# Patient Record
Sex: Female | Born: 1975 | Race: White | Hispanic: No | Marital: Married | Smoking: Former smoker
Health system: Southern US, Community
[De-identification: ages and names within clinical notes are randomized; demographics above are authoritative.]

## PROBLEM LIST (undated history)

## (undated) DIAGNOSIS — K589 Irritable bowel syndrome without diarrhea: Secondary | ICD-10-CM

## (undated) DIAGNOSIS — N979 Female infertility, unspecified: Secondary | ICD-10-CM

## (undated) DIAGNOSIS — F419 Anxiety disorder, unspecified: Secondary | ICD-10-CM

## (undated) DIAGNOSIS — K2270 Barrett's esophagus without dysplasia: Secondary | ICD-10-CM

## (undated) DIAGNOSIS — S129XXA Fracture of neck, unspecified, initial encounter: Secondary | ICD-10-CM

## (undated) DIAGNOSIS — A048 Other specified bacterial intestinal infections: Secondary | ICD-10-CM

## (undated) DIAGNOSIS — I10 Essential (primary) hypertension: Secondary | ICD-10-CM

## (undated) DIAGNOSIS — K219 Gastro-esophageal reflux disease without esophagitis: Secondary | ICD-10-CM

## (undated) HISTORY — DX: Essential (primary) hypertension: I10

## (undated) HISTORY — PX: HALO APPLICATION: SHX1720

## (undated) HISTORY — PX: DILATION AND CURETTAGE OF UTERUS: SHX78

## (undated) HISTORY — DX: Barrett's esophagus without dysplasia: K22.70

## (undated) HISTORY — DX: Anxiety disorder, unspecified: F41.9

## (undated) HISTORY — DX: Irritable bowel syndrome, unspecified: K58.9

## (undated) HISTORY — DX: Gastro-esophageal reflux disease without esophagitis: K21.9

## (undated) HISTORY — DX: Female infertility, unspecified: N97.9

---

## 2005-12-27 ENCOUNTER — Emergency Department (HOSPITAL_COMMUNITY): Admission: EM | Admit: 2005-12-27 | Discharge: 2005-12-28 | Payer: Self-pay | Admitting: Emergency Medicine

## 2006-01-03 ENCOUNTER — Ambulatory Visit (HOSPITAL_COMMUNITY): Admission: RE | Admit: 2006-01-03 | Discharge: 2006-01-03 | Payer: Self-pay | Admitting: Family Medicine

## 2007-04-11 ENCOUNTER — Ambulatory Visit (HOSPITAL_COMMUNITY): Admission: RE | Admit: 2007-04-11 | Discharge: 2007-04-11 | Payer: Self-pay | Admitting: Internal Medicine

## 2008-01-25 ENCOUNTER — Ambulatory Visit: Payer: Self-pay | Admitting: Internal Medicine

## 2008-01-25 ENCOUNTER — Ambulatory Visit (HOSPITAL_COMMUNITY): Admission: RE | Admit: 2008-01-25 | Discharge: 2008-01-25 | Payer: Self-pay | Admitting: Family Medicine

## 2008-02-11 ENCOUNTER — Ambulatory Visit: Payer: Self-pay | Admitting: Internal Medicine

## 2008-02-11 ENCOUNTER — Encounter: Payer: Self-pay | Admitting: Internal Medicine

## 2008-02-11 ENCOUNTER — Ambulatory Visit (HOSPITAL_COMMUNITY): Admission: RE | Admit: 2008-02-11 | Discharge: 2008-02-11 | Payer: Self-pay | Admitting: Internal Medicine

## 2008-02-28 ENCOUNTER — Emergency Department (HOSPITAL_COMMUNITY): Admission: EM | Admit: 2008-02-28 | Discharge: 2008-02-28 | Payer: Self-pay | Admitting: Emergency Medicine

## 2008-03-09 ENCOUNTER — Ambulatory Visit: Payer: Self-pay | Admitting: Gastroenterology

## 2008-06-15 ENCOUNTER — Ambulatory Visit: Payer: Self-pay | Admitting: Internal Medicine

## 2011-04-16 NOTE — Assessment & Plan Note (Signed)
NAMEMarland Kitchen  LOUNELL, SCHUMACHER                   CHART#:  [id number]   DATE:  03/09/2008                       DOB:  [birth year]   CHIEF COMPLAINT:  Follow-up regarding nausea, vomiting, diarrhea and  status post EGD.   HISTORY OF PRESENT ILLNESS:  Ms. Deborah Humphrey is a 35 year old female.  She has  history of chronic nausea, vomiting and epigastric pain.  She had an  upper GI SERIES which was suspicious for duodenitis.  She underwent EGD  which did show some duodenal erosions.  Biopsies were benign.  She was  found to be H.  Pylori positive, is status post Prevpac. She tells me  she has not been able to work.  She is having problems with her nausea  as well as postprandial diarrhea.  She has been having a lot of anxiety  and headaches as well.  She tells me about a week and a half after she  stopped her Prevpac she began to notice the nausea returning. She  started Nexium 40 mg daily.  She has noticed a little bit of difference  with this. She does seem to have loose stools about once a day up to 3  times a day about 2-3 days a week.  She had her last menstrual period 2  weeks ago.  She denies any history of constipation.  She denies any  history of rectal bleeding or melena.   Workup thus far has included an upper GI series which was on 01/25/08  which showed duodenitis.  She had an abdominal CT on 04/11/07 which  showed left adnexal lesion likely a cyst.  It was recommend she have a  follow-up ultrasound and she tells me she did follow up with her primary  care Deborah Humphrey regarding this. EGD with duodenal erosions accentuated  underlying benign diffuse mucosal gastric petechiae.   CURRENT MEDICATIONS:  See list from 03/09/08.   ALLERGIES:  No known drug allergies.   PHYSICAL EXAMINATION:  VITAL SIGNS:  Weight 143 pounds, height 63  inches, temperature 98.2.  Blood pressure 112/70, pulse 72.  GENERAL:  Ms. Deborah Humphrey is a well-developed, well-nourished Caucasian female  in no acute distress.  HEENT:   Sclera clear.  Nonicteric.  Oropharynx pink and moist without  lesions.  CHEST:  Heart regular rate and rhythm, normal S1/S2.  ABDOMEN:  Positive bowel sounds x4.  She does have an umbilical  ornament.  Abdomen is soft, nontender, nondistended, no palpable mass or  hepatosplenomegaly.  No rebound tenderness or guarding.  SKIN:  Pink, warm, dry, without rash, jaundice.   ASSESSMENT:  Ms. Deborah Humphrey is a 35 year old female with chronic GERD.  She  has chronic nausea. EGD showed duodenal erosions, gastric petechiae and  she has completed the Prevpac for being H. pylori positive.  She will  need to remain on PPI.   As far as her post prandial loose stools are concerned I feel she may  have irritable bowel syndrome.   PLAN:  1. Nexium 40 mg daily #60 with 3 refills.  2. I would like her to begin align probiotics once daily.  3. Levsin 0.125 milligrams a.c. and h.s. p.r.n. diarrhea, #60 with one      refill.  4. I would like her to follow up with Dr. Nobie Putnam regarding her  significant anxiety.  5. Office visit in 6 weeks with Dr. Jena Gauss to reassess how she is doing      and whether she is going to need further testing.  6. IBS literature given for her review.       Deborah Humphrey, N.P.  Electronically Signed     Kassie Mends, M.D.  Electronically Signed    KJ/MEDQ  D:  03/09/2008  T:  03/09/2008  Job:  [id number]

## 2011-04-16 NOTE — Consult Note (Signed)
Deborah, Humphrey                  ACCOUNT NO.:  [id number]   MEDICAL RECORD NO.:  [id number]          PATIENT TYPE:  AMB   LOCATION:  DAY                           FACILITY:  [facility]   PHYSICIAN:  R. Roetta Sessions, M.D. DATE OF BIRTH:  28-Jan-1976   DATE OF CONSULTATION:  DATE OF DISCHARGE:                                 CONSULTATION   REASON FOR CONSULTATION:  Chronic nausea, epigastric pain, and  tenderness.   HISTORY OF PRESENT ILLNESS:  Deborah Humphrey is a 35 year old female who  approximately one month ago she was having significant problems with  anxiety, headaches, and chronic upset stomach which included nausea,  epigastric tenderness, and vomiting.  She tells me she would stay  nauseous all day long.  She would awaken first thing in the morning with  nausea.  She did vomit on approximately four occasions.  Even when she  would not vomit she would have dry heaves.  She did have some epigastric  tenderness, but denies any severe pain, mostly discomfort.  She does  have daily heartburn and indigestion.  She was seen by Dr. Geanie Logan  office and found to be H. pylori positive.  She was started on a  Prevpac.  She took it for about a week, and then felt she was having  allergic reaction to it.  She stopped it and has since resumed it.  She  tells me she has four days left.  She has lost a total of 10 pounds in  the last month or so.  She admits to taking ibuprofen, but only a couple  of times per month.  She had an ultrasound at [facility] which  was normal January 25, 2008.  She had a CT scan of abdomen and pelvis  Apr 11, 2007.  She was found to have left ovarian cyst; otherwise,  normal exam.  She had an upper GI series on January 25, 2008 which  showed diffuse thickening of the folds of duodenal bulb compatible with  duodenitis.   PAST MEDICAL AND SURGICAL HISTORY:  Anxiety.  She has history of  cervical fracture as a child.   CURRENT MEDICATIONS:  1. Prevpac.  2.  Wellbutrin 50 mg b.i.d.  3. Ibuprofen p.r.n.   ALLERGIES:  No known drug allergies.   FAMILY HISTORY:  Mother has history of ulcerative colitis.  Father with  history of tremors.  She has two healthy sisters.   SOCIAL HISTORY:  Deborah Humphrey has been married for two months.  She has four  stepchildren.  She works a swing shift at [employer].  She has a  16 pack-year history of tobacco use.  Denies any alcohol or drug use.   REVIEW OF SYSTEMS:  See HPI.  GYN:  Last menstrual period February 2009.  She has very regular cycles.  She is not on birth control pills.  However, her husband has had a vasectomy.   PHYSICAL EXAMINATION:  VITAL SIGNS:  Weight 145 pounds, height 63  inches, temp 98.7, blood pressure 100/78, pulse 72.  GENERAL:  She is  a well-developed, well-nourished Caucasian female in no  acute distress.  HEENT.  Sclerae clear, nonicteric, conjunctivae pink.  Oropharynx pink  and moist without any lesions.  NECK:  Supple without any mass or thyromegaly.  CHEST:  Heart regular rate and rhythm, normal S1, S2 without any  murmurs, clicks, rubs or gallops.  LUNGS:  Clear to auscultation bilaterally.  ABDOMEN:  Positive bowel sounds x4.  No bruits auscultated.  Soft,  nondistended.  She does have mild tenderness to epigastrium on deep  palpation.  There is no rebound tenderness or guarding.  No  hepatosplenomegaly or mass.  She does have umbilical ornament.  EXTREMITIES:  Without clubbing or edema bilaterally.  SKIN:  Pink, warm, and dry.  She has multiple tattoos.   IMPRESSION:  Deborah Humphrey is a 35 year old female with a one month history  of chronic nausea, vomiting, and epigastric pain.  Upper GI series  suspicious for duodenitis and she was Helicobacter pylori positive.  I  suspect her symptoms are related to Helicobacter  Pylori/gastritis/duodenitis.  However, if her symptoms do not respond to  Prevpac would suggest further evaluation with EGD to rule out peptic  ulcer  disease.   PLAN:  1. Complete H. pylori treatment.  2. Then begin omeprazole 20 mg daily #30 with one refill.  3. We will go ahead and set her up for EGD in approximately two weeks.      If she does not have complete resolution of her symptoms she is      going to give me a call in the interim if things worsen or let me      know if they get better will postpone EGD.   Thank you, Dr. Nobie Putnam, for allowing [place] to participate in the care of  Ms. Hall.      Lorenza Burton, N.P.      Jonathon Bellows, M.D.  Electronically Signed    KJ/MEDQ  D:  01/26/2008  T:  01/26/2008  Job:  [id number]   cc:   Patrica Duel, M.D.  Fax: [phone]

## 2011-04-16 NOTE — Op Note (Signed)
Deborah Humphrey, Deborah Humphrey                  ACCOUNT NO.:  [id number]   MEDICAL RECORD NO.:  [id number]          PATIENT TYPE:  AMB   LOCATION:  DAY                           FACILITY:  [facility]   PHYSICIAN:  R. Roetta Sessions, M.D. DATE OF BIRTH:  [birth year]   DATE OF PROCEDURE:  02/11/3008  DATE OF DISCHARGE:  02/11/2008                               OPERATIVE REPORT   PROCEDURE:  Esophagogastroduodenoscopy with small bowel biopsy.   INDICATIONS FOR PROCEDURE:  A 35 year old lady with intermittent nausea  and epigastric pain, occasional vomiting.  Through Dr. Geanie Logan  office, H. pylori serologies were positive.  She admits to taking a pred  pack now.  Upper GI series demonstrated duodenitis but no obvious ulcer.  Prior CT demonstrated left ovarian cyst, otherwise unremarkable exam.   EGD is now being performed.  This approach has been discussed with the  patient at length.  The potential risks, benefits, and alternatives have  been reviewed and questions answered.  She is agreeable.  Please see  documentation on the medical record.   PROCEDURE NOTE:  O2 saturation, blood pressure, pulses, and respirations  were monitored throughout the entirety of the procedure.  Conscious  sedation with Versed 5 mg IV and Demerol 100 mg IV in divided doses.  Cetacaine spray for topical oropharyngeal anesthesia.   INSTRUMENT:  Pentax video chip system.   FINDINGS:  Examination of the tubular esophagus revealed no mucosal  abnormalities.  Patient had accentuating undulating Z line, otherwise  esophageal mucosa appeared normal.  The EG junction was easily  traversed.   STOMACH:  Gastric cavity was empty and insufflated well with air.  A  thorough examination of the gastric mucosa, included a retroflexed view  of the proximal stomach and the esophagogastric junction demonstrated  fine, diffuse submucosal petechiae.  There was no infiltrating process,  erosion, or other abnormality.  The pylorus was patent  and easily  traversed.  Examination of the bulb, second and third portion revealed  eroded mucosa extending down into the third portion of the duodenum  without frank ulcer or neoplasm being seen.  Biopsies of D2 and D3 were  taken for histologic study.  The patient tolerated the procedure well  and was reactive.   ENDOSCOPY IMPRESSION:  Accentuated, undulating Z line, otherwise normal  esophagus.  Diffuse mucosal gastric petechiae, otherwise normal stomach.  Patent pylorus.  Duodenal erosions from the bulb through this third  portion of the duodenum, status post biopsy.   RECOMMENDATIONS:  Complete Pred-Pak and continue PPI in the way of  Prevacid 30 mg orally daily.  A follow-up appointment with [place] in six  weeks to assess her progress.      Jonathon Bellows, M.D.  Electronically Signed     RMR/MEDQ  D:  02/11/2008  T:  02/11/2008  Job:  [id number]   cc:   Patrica Duel, M.D.  Fax: [phone]

## 2011-04-16 NOTE — Assessment & Plan Note (Signed)
Deborah Humphrey, Deborah Humphrey                   CHART#:  [id number]   DATE:  06/15/2008                       DOB:  12-24-1975   CHIEF COMPLAINT:  Diarrhea, nausea, and fatigue.   PROBLEM LIST:  1. Chronic gastroesophageal reflux disease and nausea.  2. Duodenitis.  3. Helicobacter pylori positive, status post Prevpac treatment.  4. Irritable bowel syndrome.  5. Anxiety.  6. Cervical fracture as a child.   SUBJECTIVE:  The patient is a 35 year old Caucasian female who has  history of duodenitis, chronic GERD and nausea and H. Pylori.  She is  status post EGD and benign small bowel biopsy.  She has also had  abdominal ultrasound and CT of the abdomen and pelvis with oral and IV  contrast and upper GI series.  None of which have explain her chronic  nausea nor diarrhea.  She has been off Nexium for about 3 months now.  She notes she is under a significant amount of anxiety with her job.  She has been eating less.  She has lost about 5 pounds since the last  office visit 3 months ago.  She has lost a total of 15-20 pounds within  the last 7 months.  She is having intermittent diarrhea anywhere up to 4  times per day about 3 days a week.  She does not have regular bowel  movements in between, she denies any rectal bleeding or melena.  She did  have a TSH checked in February 2009, which was normal.  She does have  history of left ovarian cyst, which she believes resolved.  She is  followed by Dr. Emelda Fear for this.   CURRENT MEDICATIONS:  See the list from 06/15/2008.   ALLERGIES:  No known drug allergies.   OBJECTIVE:  VITAL SIGNS:  Weight 140 pounds, height 63 inches,  temperature 97.9, blood pressure 112/70, and pulse 60.  GENERAL:  She is a well-developed, well-nourished Caucasian female in no  acute distress.  HEENT:  Sclerae clear.  Nonicteric.  Conjunctivae pink.  Oropharynx pink  and moist without any lesions.  CHEST:  Heart regular rate and rhythm.  Normal S1 and S2 without  murmurs, clicks, rubs, or gallops.  ABDOMEN:  Positive bowel sounds x4.  No bruits auscultated.  Soft,  nontender, and nondistended without palpable mass or hepatosplenomegaly.  No rebound, tenderness, or guarding.  EXTREMITIES:  Without clubbing or edema.   ASSESSMENT:  The patient is a 35 year old Caucasian female with what I  believe is irritable bowel syndrome.  However, her weight loss is  concerning.  I am concerned that she has functional abdominal pain and  gastroesophageal reflux disease contributing to her symptoms, and  therefore is limiting her p.o. intake.  She does have significant amount  of free-floating anxiety as well.  She has  history of Helicobacter  pylori status post triple-drug therapy.   PLAN:  1. Full set of stool studies to include ova and parasites, culture and      sensitivity,  lactoferrin, and Clostridium difficile.  2. We will check stool for Helicobacter pylori antigen.  3. Resume Nexium 40 mg daily, #31 with 5 refills.  4. Levsin 0.125 mg a.c. and h.s. up to q.i.d., #60 with 2 refills.  5. CBC, MET-7, and LFTs.  6. We will schedule for followup,  pending labs.  7. She is to follow up with Dr. Nobie Putnam regarding her anxiety.       Lorenza Burton, N.P.  Electronically Signed     R. Roetta Sessions, M.D.  Electronically Signed    KJ/MEDQ  D:  06/15/2008  T:  06/16/2008  Job:  [id number]   cc:   Patrica Duel, M.D.

## 2011-08-26 LAB — DIFFERENTIAL
Basophils Absolute: 0
Basophils Relative: 0
Eosinophils Absolute: 0.1
Eosinophils Relative: 1
Lymphocytes Relative: 24
Lymphs Abs: 1.5
Monocytes Absolute: 0.4
Monocytes Relative: 7
Neutro Abs: 4.2
Neutrophils Relative %: 68

## 2011-08-26 LAB — URINALYSIS, ROUTINE W REFLEX MICROSCOPIC
Bilirubin Urine: NEGATIVE
Glucose, UA: NEGATIVE
Hgb urine dipstick: NEGATIVE
Nitrite: NEGATIVE
Protein, ur: NEGATIVE
Specific Gravity, Urine: >1.03 — ABNORMAL HIGH
Urobilinogen, UA: 0.2
pH: 6

## 2011-08-26 LAB — LIPASE, BLOOD: Lipase: 27

## 2011-08-26 LAB — COMPREHENSIVE METABOLIC PANEL
ALT: 12
AST: 17
Albumin: 3.8
Alkaline Phosphatase: 55
BUN: 9
CO2: 25
Calcium: 9.3
Chloride: 108
Creatinine, Ser: 0.82
GFR calc Af Amer: >60
GFR calc non Af Amer: >60
Glucose, Bld: 91
Potassium: 3.8
Sodium: 138
Total Bilirubin: 0.6
Total Protein: 6.1

## 2011-08-26 LAB — CBC
HCT: 41.3
Hemoglobin: 14.5
MCHC: 35.1
MCV: 90.1
Platelets: 173
RBC: 4.58
RDW: 12.4
WBC: 6.3

## 2011-08-26 LAB — PREGNANCY, URINE: Preg Test, Ur: NEGATIVE

## 2011-12-01 ENCOUNTER — Encounter: Payer: Self-pay | Admitting: Emergency Medicine

## 2011-12-01 ENCOUNTER — Emergency Department (HOSPITAL_COMMUNITY)
Admission: EM | Admit: 2011-12-01 | Discharge: 2011-12-01 | Disposition: A | Discharge status: Home / Self Care | Payer: Self-pay | Attending: Emergency Medicine | Admitting: Emergency Medicine

## 2011-12-01 DIAGNOSIS — R112 Nausea with vomiting, unspecified: Secondary | ICD-10-CM | POA: Insufficient documentation

## 2011-12-01 DIAGNOSIS — K2970 Gastritis, unspecified, without bleeding: Secondary | ICD-10-CM | POA: Insufficient documentation

## 2011-12-01 DIAGNOSIS — R197 Diarrhea, unspecified: Secondary | ICD-10-CM | POA: Insufficient documentation

## 2011-12-01 DIAGNOSIS — R109 Unspecified abdominal pain: Secondary | ICD-10-CM | POA: Insufficient documentation

## 2011-12-01 DIAGNOSIS — K2990 Gastroduodenitis, unspecified, without bleeding: Secondary | ICD-10-CM | POA: Insufficient documentation

## 2011-12-01 HISTORY — DX: Other specified bacterial intestinal infections: A04.8

## 2011-12-01 MED ORDER — FAMOTIDINE 20 MG PO TABS
20.0000 mg | ORAL_TABLET | Freq: Once | ORAL | Status: AC
  Administered 2011-12-01: 20 mg via ORAL
  Filled 2011-12-01: qty 1

## 2011-12-01 MED ORDER — PANTOPRAZOLE SODIUM 40 MG PO TBEC
40.0000 mg | DELAYED_RELEASE_TABLET | Freq: Once | ORAL | Status: AC
Start: 2011-12-01 — End: 2011-12-01
  Administered 2011-12-01: 40 mg via ORAL
  Filled 2011-12-01: qty 1

## 2011-12-01 NOTE — ED Notes (Signed)
Patient c/o epigastric pain with nausea, vomiting, indigestion, and diarrhea x1 week. Per patient diagnosed with h pylori in 2009 and had the same symptoms. Patient reports taking tums, ibuprofen, and tylenol with no relief.

## 2011-12-01 NOTE — ED Provider Notes (Signed)
History   This chart was scribed for [employer]. Colon Branch, MD scribed by Magnus Sinning. The patient was seen in room APA06/[facility]06    CSN: [id number]  Arrival date & time 12/01/11  1228   First MD Initiated Contact with Patient 12/01/11 1555      Chief Complaint  Patient presents with  . Gastrophageal Reflux  . Nausea  . Emesis  . Diarrhea  . Abdominal Pain    (Consider location/radiation/quality/duration/timing/severity/associated sxs/prior treatment) HPI Deborah Humphrey is a 35 y.o. female who presents to the Emergency Department complaining of a constant moderate epigastric pain with associated n/v/d, onset 4 days ago. Pt reports that she took Pepto Bismuth and Tylenol with no relief. Denies any changes in diet, or fevers. Pt additionally reports that her last normal BM was yesterday and that she did take an anti-diarrheal medication with relief. Pt states that she was dx'd with pylori in 2009 and has previously had the same symptoms she is experiencing today. Pt says that she is a not a current smoker.   Past Medical History  Diagnosis Date  . Helicobacter pylori (H. pylori)     History reviewed. No pertinent past surgical history.  Family History  Problem Relation Age of Onset  . Diabetes Other   . Cancer Other     History  Substance Use Topics  . Smoking status: Former Smoker -- 1.0 packs/day for 15 years    Types: Cigarettes    Quit date: 01/31/2011  . Smokeless tobacco: Never Used  . Alcohol Use: No   Review of Systems 10 Systems reviewed and are negative for acute change except as noted in the HPI. Allergies  Review of patient's allergies indicates no known allergies.  Home Medications  No current outpatient prescriptions on file.  BP 145/75  Pulse 79  Temp(Src) 98.8 F (37.1 C) (Oral)  Resp 20  Ht 5\' 3"  (1.6 m)  Wt 195 lb (88.451 kg)  BMI 34.54 kg/m2  SpO2 100%  LMP 11/11/2011  Physical Exam  Nursing note and vitals reviewed. Constitutional: She is  oriented to person, place, and time. She appears well-developed and well-nourished. No distress.  HENT:  Head: Normocephalic and atraumatic.  Mouth/Throat: Oropharynx is clear and moist.  Eyes: EOM are normal. Pupils are equal, round, and reactive to light.  Neck: Neck supple. No tracheal deviation present.  Cardiovascular: Normal rate, regular rhythm and normal heart sounds.  Exam reveals no gallop and no friction rub.   No murmur heard. Pulmonary/Chest: Effort normal and breath sounds normal. No respiratory distress.  Abdominal: Soft. Bowel sounds are normal. She exhibits no distension. There is no tenderness. There is no rebound and no guarding.  Musculoskeletal: Normal range of motion. She exhibits no edema.  Neurological: She is alert and oriented to person, place, and time. No sensory deficit.  Skin: Skin is warm and dry.  Psychiatric: She has a normal mood and affect. Her behavior is normal.    ED Course  Procedures (including critical care time) DIAGNOSTIC STUDIES: Oxygen Saturation is 100% on room air, normal by my interpretation.    COORDINATION OF CARE:    MDM  Patient with reflux/gastritis symptoms for 1 week. Initiated pepcid AC. Provided referral to GI.Pt stable in ED with no significant deterioration in condition.The patient appears reasonably screened and/or stabilized for discharge and I doubt any other medical condition or other [facility] requiring further screening, evaluation, or treatment in the ED at this time prior to discharge.  I personally  performed the services described in this documentation, which was scribed in my presence. The recorded information has been reviewed and considered.   MDM Reviewed: nursing note and vitals         Nicoletta Dress. Colon Branch, MD 12/01/11 [phone]

## 2011-12-01 NOTE — ED Notes (Signed)
Pt states was diagnosed with H-pyloric a few yrs ago and feels she is having the same symptoms. Pt reports N/V after eating, loose stools, gastric reflux and generalized abd discomfort. Pt denies fever.

## 2012-05-21 ENCOUNTER — Encounter (HOSPITAL_COMMUNITY): Payer: Self-pay | Admitting: Emergency Medicine

## 2012-05-21 ENCOUNTER — Emergency Department (HOSPITAL_COMMUNITY)
Admission: EM | Admit: 2012-05-21 | Discharge: 2012-05-21 | Disposition: A | Discharge status: Home / Self Care | Payer: Self-pay | Attending: Emergency Medicine | Admitting: Emergency Medicine

## 2012-05-21 DIAGNOSIS — Z833 Family history of diabetes mellitus: Secondary | ICD-10-CM | POA: Insufficient documentation

## 2012-05-21 DIAGNOSIS — Z87891 Personal history of nicotine dependence: Secondary | ICD-10-CM | POA: Insufficient documentation

## 2012-05-21 DIAGNOSIS — W57XXXA Bitten or stung by nonvenomous insect and other nonvenomous arthropods, initial encounter: Secondary | ICD-10-CM | POA: Insufficient documentation

## 2012-05-21 DIAGNOSIS — S30860A Insect bite (nonvenomous) of lower back and pelvis, initial encounter: Secondary | ICD-10-CM | POA: Insufficient documentation

## 2012-05-21 HISTORY — DX: Fracture of neck, unspecified, initial encounter: S12.9XXA

## 2012-05-21 MED ORDER — PREDNISONE 20 MG PO TABS
60.0000 mg | ORAL_TABLET | Freq: Once | ORAL | Status: AC
  Administered 2012-05-21: 60 mg via ORAL
  Filled 2012-05-21: qty 3

## 2012-05-21 MED ORDER — DIPHENHYDRAMINE HCL 25 MG PO CAPS
25.0000 mg | ORAL_CAPSULE | Freq: Once | ORAL | Status: AC
  Administered 2012-05-21: 25 mg via ORAL
  Filled 2012-05-21: qty 1

## 2012-05-21 NOTE — ED Provider Notes (Signed)
History     CSN: [id number]  Arrival date & time 05/21/12  2027   First MD Initiated Contact with Patient 05/21/12 2049      Chief Complaint  Patient presents with  . Insect Bite    (Consider location/radiation/quality/duration/timing/severity/associated sxs/prior treatment) HPI Comments: Patient states she sustained a bite or sustained approximately 2 days ago. She has noticed a reddened area of the right lower abdomen. This area initially had a small blister in the center, but this has now ruptured with minimal drainage. The area is quite sore and painful with a burning sensation at times. Tonight the patient states that the pain seemed to have gotten worse and caused her to be nauseated and not feeling well. She presents to the emergency department for evaluation of this problem.  The history is provided by the patient.    Past Medical History  Diagnosis Date  . Helicobacter pylori (H. pylori)   . Cervical spine fracture     Denies havign surgery     History reviewed. No pertinent past surgical history.  Family History  Problem Relation Age of Onset  . Diabetes Other   . Cancer Other     History  Substance Use Topics  . Smoking status: Former Smoker -- 1.0 packs/day for 15 years    Types: Cigarettes    Quit date: 01/31/2011  . Smokeless tobacco: Never Used  . Alcohol Use: No    OB History    Grav Para Term Preterm Abortions TAB SAB Ect Mult Living            0      Review of Systems  Constitutional: Negative for activity change.       All ROS Neg except as noted in HPI  HENT: Negative for nosebleeds and neck pain.   Eyes: Negative for photophobia and discharge.  Respiratory: Negative for cough, shortness of breath and wheezing.   Cardiovascular: Negative for chest pain and palpitations.  Gastrointestinal: Positive for nausea. Negative for abdominal pain and blood in stool.  Genitourinary: Negative for dysuria, frequency and hematuria.  Musculoskeletal:  Negative for back pain and arthralgias.  Skin: Negative.        Insect bites  Neurological: Positive for light-headedness. Negative for dizziness, seizures and speech difficulty.  Psychiatric/Behavioral: Negative for hallucinations and confusion.    Allergies  Bee venom  Home Medications  No current outpatient prescriptions on file.  BP 133/83  Pulse 64  Temp 98.6 F (37 C) (Oral)  Resp 20  Ht 5\' 3"  (1.6 m)  Wt 190 lb (86.183 kg)  BMI 33.66 kg/m2  SpO2 100%  LMP 04/30/2012  Physical Exam  Nursing note and vitals reviewed. Constitutional: She is oriented to person, place, and time. She appears well-developed and well-nourished.  Non-toxic appearance.  HENT:  Head: Normocephalic.  Right Ear: Tympanic membrane and external ear normal.  Left Ear: Tympanic membrane and external ear normal.       Airway is patent, uvula is in the midline. Speech is clear and understandable.  Eyes: EOM and lids are normal. Pupils are equal, round, and reactive to light.  Neck: Normal range of motion. Neck supple. Carotid bruit is not present.  Cardiovascular: Normal rate, regular rhythm, normal heart sounds, intact distal pulses and normal pulses.   Pulmonary/Chest: Breath sounds normal. No respiratory distress.       No wheezes, no difficulty with breathing, no use of assessor and muscles.  Abdominal: Soft. Bowel sounds are normal. There is  no tenderness. There is no guarding.       There is a 4 cm reddened area of the right lower abdomen with what looks like a ruptured blister in the center. There no satellite abscess areas around this. There no red streaking. The area is not hot.  Musculoskeletal: Normal range of motion.  Lymphadenopathy:       Head (right side): No submandibular adenopathy present.       Head (left side): No submandibular adenopathy present.    She has no cervical adenopathy.  Neurological: She is alert and oriented to person, place, and time. She has normal strength. No  cranial nerve deficit or sensory deficit.  Skin: Skin is warm and dry.  Psychiatric: She has a normal mood and affect. Her speech is normal.    ED Course  Procedures (including critical care time)  Labs Reviewed - No data to display No results found.   No diagnosis found.    MDM  I have reviewed nursing notes, vital signs, and all appropriate lab and imaging results for this patient. Patient sustained an insect bite approximately 2 days ago she now has a red area with minimal drainage at the right lower portion of the abdomen. Patient was reassured that her lungs were clear her airway was fine. The patient is asked to use Allegra in the morning for itching or burning sensation, and Benadryl at night if needed. She is asked to soak in a couple warm water daily until this resolves. She's advised to return to the emergency department if there is abscess formation, or signs of infection.       Kathie Dike, [place] 05/21/12 2107

## 2012-05-21 NOTE — Discharge Instructions (Signed)
Please soak the lower abdomen bite area daily for 15 min. Use allegra each morning for itching or burning. Use benadryl at bedtime if needed. Observe for abscess or infection. Return if any changes or problems.Insect Bite Mosquitoes, flies, fleas, bedbugs, and many other insects can bite. Insect bites are different from insect stings. A sting is when venom is injected into the skin. Some insect bites can transmit infectious diseases. SYMPTOMS  Insect bites usually turn red, swell, and itch for 2 to 4 days. They often go away on their own. TREATMENT  Your caregiver may prescribe antibiotic medicines if a bacterial infection develops in the bite. HOME CARE INSTRUCTIONS  Do not scratch the bite area.   Keep the bite area clean and dry. Wash the bite area thoroughly with soap and water.   Put ice or cool compresses on the bite area.   Put ice in a plastic bag.   Place a towel between your skin and the bag.   Leave the ice on for 20 minutes, 4 times a day for the first 2 to 3 days, or as directed.   You may apply a baking soda paste, cortisone cream, or calamine lotion to the bite area as directed by your caregiver. This can help reduce itching and swelling.   Only take over-the-counter or prescription medicines as directed by your caregiver.   If you are given antibiotics, take them as directed. Finish them even if you start to feel better.  You may need a tetanus shot if:  You cannot remember when you had your last tetanus shot.   You have never had a tetanus shot.   The injury broke your skin.  If you get a tetanus shot, your arm may swell, get red, and feel warm to the touch. This is common and not a problem. If you need a tetanus shot and you choose not to have one, there is a rare chance of getting tetanus. Sickness from tetanus can be serious. SEEK IMMEDIATE MEDICAL CARE IF:   You have increased pain, redness, or swelling in the bite area.   You see a red line on the skin  coming from the bite.   You have a fever.   You have joint pain.   You have a headache or neck pain.   You have unusual weakness.   You have a rash.   You have chest pain or shortness of breath.   You have abdominal pain, nausea, or vomiting.   You feel unusually tired or sleepy.  MAKE SURE YOU:   Understand these instructions.   Will watch your condition.   Will get help right away if you are not doing well or get worse.  Document Released: 12/26/2004 Document Revised: 11/07/2011 Document Reviewed: 06/19/2011 [facility] Patient Information 2012 [place], [place].

## 2012-05-21 NOTE — ED Notes (Signed)
Pt states she thinks she got bit by an insect 2 days ago. Noticed a reddened area that is about 4-5cm in diameter. States she has also noticed some drainage from the area as well. States it is painful 7/10. Also complains of some nausea. Denies v/d

## 2012-05-21 NOTE — ED Provider Notes (Signed)
Medical screening examination/treatment/procedure(s) were performed by non-physician practitioner and as supervising physician I was immediately available for consultation/collaboration.   Dayton Bailiff, MD 05/21/12 2108

## 2012-05-21 NOTE — ED Notes (Signed)
Pt alert & oriented x4, stable gait. Pt given discharge instructions, paperwork. Patient instructed to stop at the registration desk to finish any additional paperwork. pt verbalized understanding. Pt left department w/ no further questions.  

## 2016-03-06 ENCOUNTER — Encounter (INDEPENDENT_AMBULATORY_CARE_PROVIDER_SITE_OTHER): Payer: Self-pay | Admitting: *Deleted

## 2016-04-01 ENCOUNTER — Ambulatory Visit (INDEPENDENT_AMBULATORY_CARE_PROVIDER_SITE_OTHER): Payer: Self-pay | Admitting: Internal Medicine

## 2019-01-27 ENCOUNTER — Other Ambulatory Visit: Payer: Self-pay | Admitting: Obstetrics and Gynecology

## 2019-02-03 ENCOUNTER — Other Ambulatory Visit: Payer: Self-pay | Admitting: Obstetrics and Gynecology

## 2019-02-03 DIAGNOSIS — Z803 Family history of malignant neoplasm of breast: Secondary | ICD-10-CM

## 2019-02-14 ENCOUNTER — Other Ambulatory Visit: Payer: Self-pay

## 2019-07-05 ENCOUNTER — Other Ambulatory Visit: Payer: Self-pay

## 2019-07-05 DIAGNOSIS — Z20822 Contact with and (suspected) exposure to covid-19: Secondary | ICD-10-CM

## 2019-07-06 LAB — NOVEL CORONAVIRUS, NAA: SARS-CoV-2, NAA: NOT DETECTED

## 2020-06-21 ENCOUNTER — Ambulatory Visit (INDEPENDENT_AMBULATORY_CARE_PROVIDER_SITE_OTHER): Payer: 59 | Admitting: Adult Health

## 2020-06-21 ENCOUNTER — Encounter: Payer: Self-pay | Admitting: Adult Health

## 2020-06-21 ENCOUNTER — Other Ambulatory Visit: Payer: Self-pay

## 2020-06-21 VITALS — BP 127/81 | HR 83 | Ht 63.0 in | Wt 223.0 lb

## 2020-06-21 DIAGNOSIS — N920 Excessive and frequent menstruation with regular cycle: Secondary | ICD-10-CM | POA: Diagnosis not present

## 2020-06-21 DIAGNOSIS — R6882 Decreased libido: Secondary | ICD-10-CM | POA: Insufficient documentation

## 2020-06-21 DIAGNOSIS — N946 Dysmenorrhea, unspecified: Secondary | ICD-10-CM | POA: Diagnosis not present

## 2020-06-21 DIAGNOSIS — N941 Unspecified dyspareunia: Secondary | ICD-10-CM | POA: Diagnosis not present

## 2020-06-21 NOTE — Progress Notes (Signed)
  Subjective:     Patient ID: Deborah Humphrey, female   DOB: 07-Oct-1976, 44 y.o.   MRN: [id number]  HPI Deborah Humphrey is a 44 year old white female, married, G1P1 in complaining of bad periods, periods last 7 days when not on depo and the first 3-4 days are heavy, changes pads and tampons every hour and had bad cramps and has decreased sex drive and pain with sex. Has had sharp pains from that radiate to rectum. She said she has been on birth control since teens and then had fertility issues. Her next dep is due 07/19/20.  She had normal pap in December 2020, in [place] She does not want any more children.  PCP is Dr Deborah Humphrey  Review of Systems  +heavy painful periods,see HPI  +pain with sex +decreased sex drive   Reviewed past medical,surgical, social and family history. Reviewed medications and allergies.     Objective:   Physical Exam BP 127/81 (BP Location: Left Arm, Patient Position: Sitting, Cuff Size: Normal)   Pulse 83   Ht 5\' 3"  (1.6 m)   Wt 223 lb (101.2 kg)   BMI 39.50 kg/m  Skin warm and dry. Neck: mid line trachea, normal thyroid, good ROM, no lymphadenopathy noted. Lungs: clear to ausculation bilaterally. Cardiovascular: regular rate and rhythm. AA is 3 Fall risk is low PHQ 9 score is 5, no SI  Upstream - 06/21/20 1522      Pregnancy Intention Screening   Does the patient want to become pregnant in the next year? No   Simultaneous filing. User may not have seen previous data.   Does the patient's partner want to become pregnant in the next year? No   Simultaneous filing. User may not have seen previous data.   Would the patient like to discuss contraceptive options today? Yes   Simultaneous filing. User may not have seen previous data.     Contraception Wrap Up   Current Method Hormonal Injection   Simultaneous filing. User may not have seen previous data.   End Method Hormonal Injection    Contraception Counseling Provided Yes   Simultaneous filing. User may not have seen  previous data.            Assessment:     1. Menorrhagia with regular cycle Will get GYN [place] in about a week then see me 2-3 days to discuss it and options  Discussed endometrial ablation vs POP  and orlissa,given handouts on Orlissa and endo ablation Could stay on depo and take Edward Qualia   Could have endometriosis    2. Dysmenorrhea  3. Dyspareunia in female   4. Decreased libido Could be related to depo     Plan:     Get [place] in about a week then see me 2-3 days later    -she will request records

## 2020-06-30 ENCOUNTER — Ambulatory Visit (INDEPENDENT_AMBULATORY_CARE_PROVIDER_SITE_OTHER): Payer: 59

## 2020-06-30 DIAGNOSIS — N920 Excessive and frequent menstruation with regular cycle: Secondary | ICD-10-CM | POA: Diagnosis not present

## 2020-06-30 DIAGNOSIS — N941 Unspecified dyspareunia: Secondary | ICD-10-CM | POA: Diagnosis not present

## 2020-06-30 DIAGNOSIS — N946 Dysmenorrhea, unspecified: Secondary | ICD-10-CM | POA: Diagnosis not present

## 2020-06-30 NOTE — Progress Notes (Signed)
US PELVIC TA/TV: heterogeneous anteverted uterus,anterior mid/lt intramural fibroid 3.1 X 2.3 X 2.3 cm,EEC 4.2 mm,normal ovaries,ovaries appear mobile,no free fluid,some discomfort during ultrasound  Chaperone Peggy

## 2020-07-05 ENCOUNTER — Ambulatory Visit (INDEPENDENT_AMBULATORY_CARE_PROVIDER_SITE_OTHER): Payer: 59 | Admitting: Adult Health

## 2020-07-05 ENCOUNTER — Encounter: Payer: Self-pay | Admitting: Adult Health

## 2020-07-05 VITALS — BP 117/78 | HR 70 | Ht 63.0 in | Wt 224.0 lb

## 2020-07-05 DIAGNOSIS — N941 Unspecified dyspareunia: Secondary | ICD-10-CM

## 2020-07-05 DIAGNOSIS — N946 Dysmenorrhea, unspecified: Secondary | ICD-10-CM

## 2020-07-05 DIAGNOSIS — R6882 Decreased libido: Secondary | ICD-10-CM

## 2020-07-05 DIAGNOSIS — N920 Excessive and frequent menstruation with regular cycle: Secondary | ICD-10-CM | POA: Diagnosis not present

## 2020-07-05 NOTE — Progress Notes (Addendum)
  Subjective:     Patient ID: Deborah Humphrey, female   DOB: [birth year], 44 y.o.   MRN: [id number]  HPI Deborah Humphrey is a 44 year old white female, married, G1P1 in with her husband to discuss her [place]. Her requested records have not arrived.  PCP is Dr Quintin Alto.  Review of Systems Has heavy painful periods, now on depo Has pain with sex Has decreased libido. Does not want more children Reviewed past medical,surgical, social and family history. Reviewed medications and allergies.     Objective:   Physical Exam BP 117/78 (BP Location: Left Arm, Patient Position: Sitting, Cuff Size: Normal)   Pulse 70   Ht 5\' 3"  (1.6 m)   Wt 224 lb (101.6 kg)   BMI 39.68 kg/m    Talk only:  US showed heterogenous uterus with small intramural fibroid, 3.1 cm x 2.3 cm x 2.3 cm, normal ovaries, no fluid and EEC 4.2 mm. She had some pain with [place]. Time with pt and husband was 27 minutes  Assessment:     1. Menorrhagia with regular cycle Discussed ablation and tubal vs POP and orlissa and she is leaning toward ablation and tubal with tubes removed to decrease ovarian cancer risk   2. Dysmenorrhea   3. Decreased libido   4. Dyspareunia in female     Plan:    Review handout on POP  Return 8/18 to talk with Dr Glo Herring about ablation and tubal as option and get his thoughts

## 2020-07-05 NOTE — Patient Instructions (Signed)
Norethindrone tablets (contraception) What is this medicine? NORETHINDRONE (nor eth IN drone) is an oral contraceptive. The product contains a female hormone known as a progestin. It is used to prevent pregnancy. This medicine may be used for other purposes; ask your health care provider or pharmacist if you have questions. COMMON BRAND NAME(S): Camila, Deblitane 28-Day, Errin, Heather, Jencycla, Jolivette, Lyza, Nor-QD, Nora-BE, Norlyroc, Ortho Micronor, Sharobel 28-Day What should I tell my health care provider before I take this medicine? They need to know if you have any of these conditions:  blood vessel disease or blood clots  breast, cervical, or vaginal cancer  diabetes  heart disease  kidney disease  liver disease  mental depression  migraine  seizures  stroke  vaginal bleeding  an unusual or allergic reaction to norethindrone, other medicines, foods, dyes, or preservatives  pregnant or trying to get pregnant  breast-feeding How should I use this medicine? Take this medicine by mouth with a glass of water. You may take it with or without food. Follow the directions on the prescription label. Take this medicine at the same time each day and in the order directed on the package. Do not take your medicine more often than directed. Contact your pediatrician regarding the use of this medicine in children. Special care may be needed. This medicine has been used in female children who have started having menstrual periods. A patient package insert for the product will be given with each prescription and refill. Read this sheet carefully each time. The sheet may change frequently. Overdosage: If you think you have taken too much of this medicine contact a poison control center or emergency room at once. NOTE: This medicine is only for you. Do not share this medicine with others. What if I miss a dose? Try not to miss a dose. Every time you miss a dose or take a dose late  your chance of pregnancy increases. When 1 pill is missed (even if only 3 hours late), take the missed pill as soon as possible and continue taking a pill each day at the regular time (use a back up method of birth control for the next 48 hours). If more than 1 dose is missed, use an additional birth control method for the rest of your pill pack until menses occurs. Contact your health care professional if more than 1 dose has been missed. What may interact with this medicine? Do not take this medicine with any of the following medications:  amprenavir or fosamprenavir  bosentan This medicine may also interact with the following medications:  antibiotics or medicines for infections, especially rifampin, rifabutin, rifapentine, and griseofulvin, and possibly penicillins or tetracyclines  aprepitant  barbiturate medicines, such as phenobarbital  carbamazepine  felbamate  modafinil  oxcarbazepine  phenytoin  ritonavir or other medicines for HIV infection or AIDS  St. John's wort  topiramate This list may not describe all possible interactions. Give your health care provider a list of all the medicines, herbs, non-prescription drugs, or dietary supplements you use. Also tell them if you smoke, drink alcohol, or use illegal drugs. Some items may interact with your medicine. What should I watch for while using this medicine? Visit your doctor or health care professional for regular checks on your progress. You will need a regular breast and pelvic exam and Pap smear while on this medicine. Use an additional method of birth control during the first cycle that you take these tablets. If you have any reason to think you   are pregnant, stop taking this medicine right away and contact your doctor or health care professional. If you are taking this medicine for hormone related problems, it may take several cycles of use to see improvement in your condition. This medicine does not protect you  against HIV infection (AIDS) or any other sexually transmitted diseases. What side effects may I notice from receiving this medicine? Side effects that you should report to your doctor or health care professional as soon as possible:  breast tenderness or discharge  pain in the abdomen, chest, groin or leg  severe headache  skin rash, itching, or hives  sudden shortness of breath  unusually weak or tired  vision or speech problems  yellowing of skin or eyes Side effects that usually do not require medical attention (report to your doctor or health care professional if they continue or are bothersome):  changes in sexual desire  change in menstrual flow  facial hair growth  fluid retention and swelling  headache  irritability  nausea  weight gain or loss This list may not describe all possible side effects. Call your doctor for medical advice about side effects. You may report side effects to FDA at 1-800-FDA-1088. Where should I keep my medicine? Keep out of the reach of children. Store at room temperature between 15 and 30 degrees C (59 and 86 degrees F). Throw away any unused medicine after the expiration date. NOTE: This sheet is a summary. It may not cover all possible information. If you have questions about this medicine, talk to your doctor, pharmacist, or health care provider.  2020 Elsevier/Gold Standard (2012-08-07 16:41:35)  

## 2020-07-19 ENCOUNTER — Ambulatory Visit: Payer: 59 | Admitting: Obstetrics and Gynecology

## 2020-07-19 ENCOUNTER — Other Ambulatory Visit: Payer: Self-pay

## 2020-07-19 ENCOUNTER — Encounter: Payer: Self-pay | Admitting: Obstetrics and Gynecology

## 2020-07-19 VITALS — BP 132/79 | HR 87 | Ht 63.0 in | Wt 223.6 lb

## 2020-07-19 DIAGNOSIS — Z302 Encounter for sterilization: Secondary | ICD-10-CM

## 2020-07-19 DIAGNOSIS — N920 Excessive and frequent menstruation with regular cycle: Secondary | ICD-10-CM | POA: Diagnosis not present

## 2020-07-19 NOTE — Patient Instructions (Addendum)
Uterine Fibroids  Uterine fibroids (leiomyomas) are noncancerous (benign) tumors that can develop in the uterus. Fibroids may also develop in the fallopian tubes, cervix, or tissues (ligaments) near the uterus. You may have one or many fibroids. Fibroids vary in size, weight, and where they grow in the uterus. Some can become quite large. Most fibroids do not require medical treatment. What are the causes? The cause of this condition is not known. What increases the risk? You are more likely to develop this condition if you:  Are in your 30s or 40s and have not gone through menopause.  Have a family history of this condition.  Are of African-American descent.  Had your first period at an early age (early menarche).  Have not had any children (nulliparity).  Are overweight or obese. What are the signs or symptoms? Many women do not have any symptoms. Symptoms of this condition may include:  Heavy menstrual bleeding.  Bleeding or spotting between periods.  Pain and pressure in the pelvic area, between the hips.  Bladder problems, such as needing to urinate urgently or more often than usual.  Inability to have children (infertility).  Failure to carry pregnancy to term (miscarriage). How is this diagnosed? This condition may be diagnosed based on:  Your symptoms and medical history.  A physical exam.  A pelvic exam that includes feeling for any tumors.  Imaging tests, such as ultrasound or MRI. How is this treated? Treatment for this condition may include:  Seeing your health care provider for follow-up visits to monitor your fibroids for any changes.  Taking NSAIDs such as ibuprofen, naproxen, or aspirin to reduce pain.  Hormone medicines. These may be taken as a pill, given in an injection, or delivered by a T-shaped device that is inserted into the uterus (intrauterine device, IUD).  Surgery to remove one of the following: ? The fibroids (myomectomy). Your health  care provider may recommend this if fibroids affect your fertility and you want to become pregnant. ? The uterus (hysterectomy). ? Blood supply to the fibroids (uterine artery embolization). Follow these instructions at home:  Take over-the-counter and prescription medicines only as told by your health care provider.  Ask your health care provider if you should take iron pills or eat more iron-rich foods, such as dark green, leafy vegetables. Heavy menstrual bleeding can cause low iron levels.  If directed, apply heat to your back or abdomen to reduce pain. Use the heat source that your health care provider recommends, such as a moist heat pack or a heating pad. ? Place a towel between your skin and the heat source. ? Leave the heat on for 20-30 minutes. ? Remove the heat if your skin turns bright red. This is especially important if you are unable to feel pain, heat, or cold. You may have a greater risk of getting burned.  Pay close attention to your menstrual cycle. Tell your health care provider about any changes, such as: ? Increased blood flow that requires you to use more pads or tampons than usual. ? A change in the number of days that your period lasts. ? A change in symptoms that are associated with your period, such as back pain or cramps in your abdomen.  Keep all follow-up visits as told by your health care provider. This is important, especially if your fibroids need to be monitored for any changes. Contact a health care provider if you:  Have pelvic pain, back pain, or cramps in your abdomen that   do not get better with medicine or heat.  Develop new bleeding between periods.  Have increased bleeding during or between periods.  Feel unusually tired or weak.  Feel light-headed. Get help right away if you:  Faint.  Have pelvic pain that suddenly gets worse.  Have severe vaginal bleeding that soaks a tampon or pad in 30 minutes or less. Summary  Uterine fibroids are  noncancerous (benign) tumors that can develop in the uterus.  The exact cause of this condition is not known.  Most fibroids do not require medical treatment unless they affect your ability to have children (fertility).  Contact a health care provider if you have pelvic pain, back pain, or cramps in your abdomen that do not get better with medicines.  Make sure you know what symptoms should cause you to get help right away. This information is not intended to replace advice given to you by your health care provider. Make sure you discuss any questions you have with your health care provider. Document Revised: 10/31/2017 Document Reviewed: 10/14/2017 Elsevier Patient Education  2020 Elsevier Inc.  Endometrial Ablation Endometrial ablation is a procedure that destroys the thin inner layer of the lining of the uterus (endometrium). This procedure may be done:  To stop heavy periods.  To stop bleeding that is causing anemia.  To control irregular bleeding.  To treat bleeding caused by small tumors (fibroids) in the endometrium. This procedure is often an alternative to major surgery, such as removal of the uterus and cervix (hysterectomy). As a result of this procedure:  You may not be able to have children. However, if you are premenopausal (you have not gone through menopause): ? You may still have a small chance of getting pregnant. ? You will need to use a reliable method of birth control after the procedure to prevent pregnancy.  You may stop having a menstrual period, or you may have only a small amount of bleeding during your period. Menstruation may return several years after the procedure. Tell a health care provider about:  Any allergies you have.  All medicines you are taking, including vitamins, herbs, eye drops, creams, and over-the-counter medicines.  Any problems you or family members have had with the use of anesthetic medicines.  Any blood disorders you  have.  Any surgeries you have had.  Any medical conditions you have. What are the risks? Generally, this is a safe procedure. However, problems may occur, including:  A hole (perforation) in the uterus or bowel.  Infection of the uterus, bladder, or vagina.  Bleeding.  Damage to other structures or organs.  An air bubble in the lung (air embolus).  Problems with pregnancy after the procedure.  Failure of the procedure.  Decreased ability to diagnose cancer in the endometrium. What happens before the procedure?  You will have tests of your endometrium to make sure there are no pre-cancerous cells or cancer cells present.  You may have an ultrasound of the uterus.  You may be given medicines to thin the endometrium.  Ask your health care provider about: ? Changing or stopping your regular medicines. This is especially important if you take diabetes medicines or blood thinners. ? Taking medicines such as aspirin and ibuprofen. These medicines can thin your blood. Do not take these medicines before your procedure if your doctor tells you not to.  Plan to have someone take you home from the hospital or clinic. What happens during the procedure?   You will lie on an   exam table with your feet and legs supported as in a pelvic exam.  To lower your risk of infection: ? Your health care team will wash or sanitize their hands and put on germ-free (sterile) gloves. ? Your genital area will be washed with soap.  An IV tube will be inserted into one of your veins.  You will be given a medicine to help you relax (sedative).  A surgical instrument with a light and camera (resectoscope) will be inserted into your vagina and moved into your uterus. This allows your surgeon to see inside your uterus.  Endometrial tissue will be removed using one of the following methods: ? Radiofrequency. This method uses a radiofrequency-alternating electric current to remove the  endometrium. ? Cryotherapy. This method uses extreme cold to freeze the endometrium. ? Heated-free liquid. This method uses a heated saltwater (saline) solution to remove the endometrium. ? Microwave. This method uses high-energy microwaves to heat up the endometrium and remove it. ? Thermal balloon. This method involves inserting a catheter with a balloon tip into the uterus. The balloon tip is filled with heated fluid to remove the endometrium. The procedure may vary among health care providers and hospitals. What happens after the procedure?  Your blood pressure, heart rate, breathing rate, and blood oxygen level will be monitored until the medicines you were given have worn off.  As tissue healing occurs, you may notice vaginal bleeding for 4-6 weeks after the procedure. You may also experience: ? Cramps. ? Thin, watery vaginal discharge that is light pink or brown in color. ? A need to urinate more frequently than usual. ? Nausea.  Do not drive for 24 hours if you were given a sedative.  Do not have sex or insert anything into your vagina until your health care provider approves. Summary  Endometrial ablation is done to treat the many causes of heavy menstrual bleeding.  The procedure may be done only after medications have been tried to control the bleeding.  Plan to have someone take you home from the hospital or clinic. This information is not intended to replace advice given to you by your health care provider. Make sure you discuss any questions you have with your health care provider. Document Revised: 05/05/2018 Document Reviewed: 12/05/2016 Elsevier Patient Education  2020 [employer]vier Inc.  

## 2020-07-19 NOTE — Progress Notes (Addendum)
PATIENT ID: Deborah Humphrey, female     DOB: Sep 15, 1976, 44 y.o.     MRN: [id number]   [facility] Visit  07/19/20     PATIENT NAME: Deborah Humphrey     MRN [id number]     DOB: 10/21/1976  CC & HPI:  No chief complaint on file.  Deborah Humphrey is a 43 y.o. female presenting today for evaluation of her heavy menses and pain on intercourse. She said that intercourse is painful and feels sharp; it feels like he's bumping into something. As for her menses, it's irregular and she has a lot of breakthrough bleeding even with contraception.   She would cramp during her menses and then have sharp shooting pains between her cycles as well. During her cycles, she would have a cycle that would last about 7-8 days, the first three of which she would bleed through both a pad and a tampon within an hour.   She has Hx of cesarean.   ROS:  Review of Systems  Constitutional: Negative.   HENT: Negative.   Eyes: Negative.   Respiratory: Negative.   Cardiovascular: Negative.   Gastrointestinal: Negative.   Endocrine: Negative.   Genitourinary: Positive for dyspareunia and menstrual problem.  Musculoskeletal: Negative.   Skin: Negative.   Allergic/Immunologic: Negative.   Neurological: Negative.   Hematological: Negative.   Psychiatric/Behavioral: Negative.   All other systems reviewed and are negative.    Pertinent History Reviewed:  Medical         Past Medical History:  Diagnosis Date  . Cervical spine fracture (HCC)    Denies havign surgery   . Helicobacter pylori (H. pylori)   . Hypertension    gestational hypertension  . Infertility, female                               Surgical Hx:    Past Surgical History:  Procedure Laterality Date  . CESAREAN SECTION     Medications: Reviewed & Updated - see associated section                       Current Outpatient Medications:  .  cyclobenzaprine (FLEXERIL) 10 MG tablet, Take 10 mg by mouth 2 (two) times daily as needed., Disp: , Rfl:   .  diclofenac (VOLTAREN) 75 MG EC tablet, Take 75 mg by mouth 2 (two) times daily., Disp: , Rfl:  .  HYDROcodone-acetaminophen (NORCO/VICODIN) 5-325 MG tablet, Take by mouth., Disp: , Rfl:  .  ibuprofen (ADVIL) 800 MG tablet, Take by mouth., Disp: , Rfl:  .  medroxyPROGESTERone (DEPO-PROVERA) 150 MG/ML injection, Inject into the muscle., Disp: , Rfl:    Social History: Reviewed -  reports that she quit smoking about 9 years ago. Her smoking use included cigarettes. She has a 15.00 pack-year smoking history. She has never used smokeless tobacco.  Objective Findings:  Vitals: Blood pressure 132/79, pulse 87, height 5\' 3"  (1.6 m), weight 223 lb 9.6 oz (101.4 kg).  PHYSICAL EXAMINATION General appearance - alert, well appearing, and in no distress, oriented to person, place, and time, overweight and well hydrated Mental status - alert, oriented to person, place, and time, normal mood, behavior, speech, dress, motor activity, and thought processes, affect appropriate to mood Chest - not examined Heart - not examined Abdomen - not examined Breasts - not examined Skin - normal coloration and turgor, no rashes, no  suspicious skin lesions noted    GYNECOLOGIC SONOGRAM   Deborah Humphrey is a 44 y.o. G1P1001 No LMP recorded. Patient has had an injection. She is here for a pelvic sonogram for menorrhagia,dysmenorrhea and dyspareunia.  Uterus                      8.2 x 5.5 x 7.5 cm, Total uterine volume 176 cc, heterogeneous anteverted uterus,anterior mid/left intramural fibroid 3.1 X 2.3 X 2.3 cm  Endometrium          4.2 mm, symmetrical, wnl  Right ovary             3.3 x 2.2 x 3 cm, wnl  Left ovary                3 x 2.2 x 2.7 cm, wnl  No free fluid  Technician Comments:  US PELVIC TA/TV: heterogeneous anteverted uterus,anterior mid/left intramural fibroid 3.1 X 2.3 X 2.3 cm,EEC 4.2 mm,normal ovaries,ovaries appear mobile,no free fluid,some discomfort during  ultrasound  Chaperone [address] Heide Guile 06/30/2020 12:39 PM  Clinical Impression and recommendations:  I have reviewed the sonogram results above.   Combined with the patient's current clinical course, below are my impressions and any appropriate recommendations for management based on the sonographic findings:   FINDINGS:  I                             Maternal uterus: anteverted, anteflexed, with evidence of prior cesarean with low transverse uterine scar.                                                             Anterior Type 1 uterine fibroid on the anterior uterine wall which deforms the uterine endometrial stripe, extending approximately 1/3 the width of the fibroid in to the uterine endometrium view prior cesarean scar seen                               adnexae: Ovaries are  within normal limits.                                                                                                                                            Right ovary normal mobile with vaginal probe manipulation  Left ovary    Normal mobile                                                                        Free Fluid  n/a wq " P with   Jonnie Kind 07/07/2020     Assessment & Plan:   A:  1. Discussion: Discussed with pt risks and benefits of endometrial ablation. Advised pt that intrauterine pregnancy is nearly 100% preventable with ablation. Discussed reduced risk of ectopic pregnancy and reduced risk of ovarian cancer from 1 in 100 to 1 in 300 with bilateral salpingectomy in a lengthy conversation with risks benefits rationale and alternatives including IUD reviewed. Brochures given.   At end of discussion, pt had opportunity to ask questions and has no further questions at this time.   Specific discussion of endometrial ablation and bilateral salpingectomy as noted above. Greater than 50% was spent  in counseling and coordination of care with the patient.   Total time greater than: 20 minutes.      2. Discussion: Discussed with pt risks and benefits of BTL. Discussed using clips only vs bilateral salpingectomy. Advised pt that bilateral salpingectomy is a permanent procedure, but reduces the risk of cancer by 2/3.  At end of discussion, pt had opportunity to ask questions and has no further questions at this time.   Specific discussion of permanent sterilization as noted above. Greater than 50% was spent in counseling and coordination of care with the patient.   Total time greater than: 10 minutes.      P:  1.  Schedule for salpingectomy and uterine ablation 2. Will arrange for endometrial biopsy before surgery as a part of preop exam.   By signing my name below, I, [employer], attest that this documentation has been prepared under the direction and in the presence of Jonnie Kind, MD. Electronically Signed: [place]. 07/19/20. 3:13 PM.  I personally performed the services described in this documentation, which was SCRIBED in my presence. The recorded information has been reviewed and considered accurate. It has been edited as necessary during review. Jonnie Kind, MD

## 2020-07-31 ENCOUNTER — Encounter: Payer: Self-pay | Admitting: Obstetrics and Gynecology

## 2020-07-31 ENCOUNTER — Ambulatory Visit (INDEPENDENT_AMBULATORY_CARE_PROVIDER_SITE_OTHER): Payer: 59 | Admitting: Obstetrics and Gynecology

## 2020-07-31 ENCOUNTER — Other Ambulatory Visit: Payer: Self-pay | Admitting: Obstetrics and Gynecology

## 2020-07-31 VITALS — BP 122/71 | HR 112 | Ht 63.0 in | Wt 221.6 lb

## 2020-07-31 DIAGNOSIS — Z3202 Encounter for pregnancy test, result negative: Secondary | ICD-10-CM

## 2020-07-31 DIAGNOSIS — N939 Abnormal uterine and vaginal bleeding, unspecified: Secondary | ICD-10-CM

## 2020-07-31 NOTE — Progress Notes (Signed)
Patient given informed consent, signed copy in the chart, time out was performed. Appropriate time out taken. . The patient was placed in the lithotomy position and the cervix brought into view with sterile speculum.  Portio of cervix cleansed x 2 with betadine swabs.  A tenaculum was placed in the anterior lip of the cervix.  The uterus was sounded for depth of 9 cm. A pipelle was introduced to into the uterus, suction created,  and an endometrial sample was obtained. All equipment was removed and accounted for.  The patient tolerated the procedure well.    Patient given post procedure instructions. The patient will return for ablation and salpingectomy as scheduled 21 Sept.2021

## 2020-08-06 NOTE — Progress Notes (Signed)
Benign endometrium on biopsy, as expected.  Will proceed with hysteroscopy, D&C, Endometrial ablation and tubal ligation

## 2020-08-15 ENCOUNTER — Other Ambulatory Visit: Payer: Self-pay | Admitting: Obstetrics and Gynecology

## 2020-08-15 NOTE — Progress Notes (Signed)
PATIENT ID: Deborah Humphrey, female     DOB: 1976-08-14, 44 y.o.     MRN: [id number]   [facility] Visit  07/19/20     PATIENT NAME: Deborah Humphrey     MRN [id number]     DOB: [birth year]  CC & HPI:  No chief complaint on file.  Deborah Humphrey is a 44 y.o. female presenting today for evaluation of her heavy menses and pain on intercourse. She said that intercourse is painful and feels sharp; it feels like he's bumping into something. As for her menses, it's irregular and she has a lot of breakthrough bleeding even with contraception.   She would cramp during her menses and then have sharp shooting pains between her cycles as well. During her cycles, she would have a cycle that would last about 7-8 days, the first three of which she would bleed through both a pad and a tampon within an hour.   She has Hx of cesarean.   ROS:  Review of Systems  Constitutional: Negative.   HENT: Negative.   Eyes: Negative.   Respiratory: Negative.   Cardiovascular: Negative.   Gastrointestinal: Negative.   Endocrine: Negative.   Genitourinary: Positive for dyspareunia and menstrual problem.  Musculoskeletal: Negative.   Skin: Negative.   Allergic/Immunologic: Negative.   Neurological: Negative.   Hematological: Negative.   Psychiatric/Behavioral: Negative.   All other systems reviewed and are negative.    Pertinent History Reviewed:  Medical         Past Medical History:  Diagnosis Date  . Cervical spine fracture (HCC)    Denies havign surgery   . Helicobacter pylori (H. pylori)   . Hypertension    gestational hypertension  . Infertility, female                               Surgical Hx:         Past Surgical History:  Procedure Laterality Date  . CESAREAN SECTION     Medications: Reviewed & Updated - see associated section                       Current Outpatient Medications:  .  cyclobenzaprine (FLEXERIL) 10 MG tablet, Take 10 mg by mouth 2 (two) times  daily as needed., Disp: , Rfl:  .  diclofenac (VOLTAREN) 75 MG EC tablet, Take 75 mg by mouth 2 (two) times daily., Disp: , Rfl:  .  HYDROcodone-acetaminophen (NORCO/VICODIN) 5-325 MG tablet, Take by mouth., Disp: , Rfl:  .  ibuprofen (ADVIL) 800 MG tablet, Take by mouth., Disp: , Rfl:  .  medroxyPROGESTERone (DEPO-PROVERA) 150 MG/ML injection, Inject into the muscle., Disp: , Rfl:    Social History: Reviewed -  reports that she quit smoking about 9 years ago. Her smoking use included cigarettes. She has a 15.00 pack-year smoking history. She has never used smokeless tobacco.  Objective Findings:  Vitals: Blood pressure 132/79, pulse 87, height 5\' 3"  (1.6 m), weight 223 lb 9.6 oz (101.4 kg).  PHYSICAL EXAMINATION General appearance - alert, well appearing, and in no distress, oriented to person, place, and time, overweight and well hydrated Mental status - alert, oriented to person, place, and time, normal mood, behavior, speech, dress, motor activity, and thought processes, affect appropriate to mood Chest - not examined Heart - not examined Abdomen - not examined Breasts - not examined Skin - normal coloration  and turgor, no rashes, no suspicious skin lesions noted    GYNECOLOGIC SONOGRAM   Deborah Humphrey a 44 y.o.G1P1001 No LMP recorded. Patient has had an injection.She is here for a pelvic sonogram for menorrhagia,dysmenorrhea and dyspareunia.  Uterus 8.2 x 5.5 x 7.5 cm, Total uterine volume 176 cc, heterogeneous anteverted uterus,anterior mid/left intramural fibroid 3.1 X 2.3 X 2.3 cm  Endometrium 4.2 mm, symmetrical, wnl  Right ovary 3.3 x 2.2 x 3 cm, wnl  Left ovary 3 x 2.2 x 2.7 cm, wnl  No free fluid  Technician Comments:  US PELVIC TA/TV: heterogeneous anteverted uterus,anterior mid/left intramural fibroid 3.1 X 2.3 X 2.3 cm,EEC 4.2 mm,normal ovaries,ovaries appear mobile,no free fluid,some  discomfort during ultrasound  Chaperone [address] Deborah Humphrey 06/30/2020 12:39 PM  Clinical Impression and recommendations:  I have reviewed the sonogram results above.   Combined with the patient's current clinical course, below are my impressions and any appropriate recommendations for management based on the sonographic findings:   FINDINGS:  I Maternal uterus: anteverted, anteflexed, with evidence of prior cesarean with low transverse uterine scar. Anterior Type 1 uterine fibroid on the anterior uterine wall which deforms the uterine endometrial stripe, extending approximately 1/3 the width of the fibroid in to the uterine endometrium view prior cesarean scar seen adnexae: Ovaries are within normal limits.  Right ovary normal mobile with vaginal probe manipulation Left ovary Normal mobile  Free Fluid n/a wq " P with   Deborah Humphrey 07/07/2020     Assessment & Plan:   A:  1. Discussion: Discussed with pt risks and benefits of endometrial ablation. Advised pt that intrauterine pregnancy is nearly 100% preventable with ablation. Discussed reduced risk of ectopic pregnancy and reduced risk of ovarian cancer from 1 in 100 to 1 in 300 with bilateral salpingectomy in a lengthy conversation with risks benefits rationale and alternatives including IUD reviewed. Brochures given.   At end of discussion, pt had opportunity to ask questions and has no further questions at this time.   Specific discussion of endometrial ablation and bilateral salpingectomy as noted above.  Greater than 50% was spent in counseling and coordination of care with the patient.   Total time greater than: 20 minutes.      2. Discussion: Discussed with pt risks and benefits of BTL. Discussed using clips only vs bilateral salpingectomy. Advised pt that bilateral salpingectomy is a permanent procedure, but reduces the risk of cancer by 2/3.  At end of discussion, pt had opportunity to ask questions and has no further questions at this time.   Specific discussion of permanent sterilization as noted above. Greater than 50% was spent in counseling and coordination of care with the patient.   Total time greater than: 10 minutes.      P:  1.  Schedule for salpingectomy and uterine ablation Will arrange for endometrial biopsy before surgery as a part of preop exam. Results have returned benign.Benign endometrium on biopsy, as expected.  2. Will proceed with hysteroscopy, D&C, Endometrial ablation and tubal ligation   By signing my name below, I, [employer], attest that this documentation has been prepared under the direction and in the presence of Deborah Kind, MD. Electronically Signed: [place]. 07/19/20. 3:13 PM.  I personally performed the services described in this documentation, which was SCRIBED in my presence. The recorded information has been reviewed and considered accurate. It has been edited as necessary during review. Deborah Kind,  MD

## 2020-08-16 NOTE — Patient Instructions (Addendum)
Deborah Humphrey  08/16/2020     @PREFPERIOPPHARMACY @   Your procedure is scheduled on Tuesday, 08/22/20.  Report to [facility] at 0830 A.M.  Call this number if you have problems the morning of surgery:  [phone]   Remember:  Do not eat or drink after midnight.     Take these medicines the morning of surgery with A SIP OF WATER none    Do not wear jewelry, make-up or nail polish.  Do not wear lotions, powders, or perfumes, or deodorant.  Do not shave 48 hours prior to surgery.  Men may shave face and neck.  Do not bring valuables to the hospital.  [facility] is not responsible for any belongings or valuables.  Contacts, dentures or bridgework may not be worn into surgery.  Leave your suitcase in the car.  After surgery it may be brought to your room.  For patients admitted to the hospital, discharge time will be determined by your treatment team.  Patients discharged the day of surgery will not be allowed to drive home.   Name and phone number of your driver:   family Special instructions:  none  Please read over the following fact sheets that you were given. Surgical Site Infection Prevention and Anesthesia Post-op Instructions      Salpingectomy, Care After This sheet gives you information about how to care for yourself after your procedure. Your health care provider may also give you more specific instructions. If you have problems or questions, contact your health care provider. What can I expect after the procedure? After the procedure, it is common to have:  Pain in your abdomen.  Some light vaginal bleeding (spotting) for a few days.  Tiredness. Your recovery time will vary depending on which method your surgeon used for your surgery. Follow these instructions at home: Incision care   Follow instructions from your health care provider about how to take care of your incisions. Make sure you: ? Wash your hands with soap and water before and after you  change your bandage (dressing). If soap and water are not available, use hand sanitizer. ? Change or remove your dressing as told by your health care provider. ? Leave any stitches (sutures), skin glue, or adhesive strips in place. These skin closures may need to stay in place for 2 weeks or longer. If adhesive strip edges start to loosen and curl up, you may trim the loose edges. Do not remove adhesive strips completely unless your health care provider tells you to do that.  Keep your dressing clean and dry.  Check your incision area every day for signs of infection. Check for: ? Redness, swelling, or pain that gets worse. ? Fluid or blood. ? Warmth. ? Pus or a bad smell. Activity  Rest as told by your health care provider.  Avoid sitting for a long time without moving. Get up to take short walks every 1-2 hours. This is important to improve blood flow and breathing. Ask for help if you feel weak or unsteady.  Return to your normal activities as told by your health care provider. Ask your health care provider what activities are safe for you.  Do not drive until your health care provider says that it is safe.  Do not lift anything that is heavier than 10 lb (4.5 kg), or the limit that you are told, until your health care provider says that it is safe. This may be 2-6 weeks depending on your surgery.  Until your health care provider approves: ? Do not douche. ? Do not use tampons. ? Do not have sex. Medicines  Take over-the-counter and prescription medicines only as told by your health care provider.  Ask your health care provider if the medicine prescribed to you: ? Requires you to avoid driving or using heavy machinery. ? Can cause constipation. You may need to take actions to prevent or treat constipation, such as:  Drink enough fluid to keep your urine pale yellow.  Take over-the-counter or prescription medicines.  Eat foods that are high in fiber, such as beans, whole  grains, and fresh fruits and vegetables.  Limit foods that are high in fat and processed sugars, such as fried or sweet foods. General instructions  Wear compression stockings as told by your health care provider. These stockings help to prevent blood clots and reduce swelling in your legs.  Do not use any products that contain nicotine or tobacco, such as cigarettes, e-cigarettes, and chewing tobacco. If you need help quitting, ask your health care provider.  Do not take baths, swim, or use a hot tub until your health care provider approves. You may take showers.  Keep all follow-up visits as told by your health care provider. This is important. Contact a health care provider if you have:  Pain when you urinate.  Redness, swelling, or pain around an incision.  Fluid or blood coming from an incision.  Pus or a bad smell coming from an incision.  An incision that feels warm to the touch.  A fever.  Abdominal pain that gets worse or does not get better with medicine.  An incision that starts to break open.  A rash.  Light-headedness.  Nausea and vomiting. Get help right away if you:  Have pain in your chest or leg.  Develop shortness of breath.  Faint.  Have increased or heavy vaginal bleeding, such as soaking a pad in an hour. Summary  After the procedure, it is common to feel tired, have some pain in your abdomen, and have some light vaginal bleeding for a few days.  Follow instructions from your health care provider about how to take care of your incisions.  Return to your normal activities as told by your health care provider. Ask your health care provider what activities are safe for you.  Do not douche, use tampons, or have sex until your health care provider approves.  Keep all follow-up visits as told by your health care provider. This information is not intended to replace advice given to you by your health care provider. Make sure you discuss any  questions you have with your health care provider. Document Revised: 11/09/2018 Document Reviewed: 11/09/2018 Elsevier Patient Education  [place], also called tubectomy, is the surgical removal of one of the fallopian tubes. The fallopian tubes are where eggs travel from the ovaries to the uterus. Removing one fallopian tube does not prevent you from becoming pregnant. It also does not cause problems with your menstrual periods. You may need this procedure if you:  Have a fertilized egg that attaches to the fallopian tube (ectopic pregnancy), especially one that causes the tube to burst or tear (rupture).  Have an infected fallopian tube.  Have cancer of the fallopian tube or nearby organs.  Have had an ovary removed due to a cyst or tumor.  Have had your uterus removed.  Are at high risk for ovarian cancer. There are three different methods that  can be used for a salpingectomy:  An open method that involves making one large incision in your abdomen.  A laparoscopic method that involves using a thin, lighted tube with a tiny camera on the end (laparoscope) to help perform the procedure. The laparoscope will allow your surgeon to make several small incisions in the abdomen instead of one large incision.  A robot-assisted method that involves using a computer to control surgical instruments that are attached to robotic arms. Tell a health care provider about:  Any allergies you have.  All medicines you are taking, including vitamins, herbs, eye drops, creams, and over-the-counter medicines.  Any problems you or family members have had with anesthetic medicines.  Any blood disorders you have.  Any surgeries you have had.  Any medical conditions you have.  Whether you are pregnant or may be pregnant. What are the risks? Generally, this is a safe procedure. However, problems may occur, including:  Infection.  Bleeding.  Allergic  reactions to medicines.  Blood clots in the legs or lungs.  Damage to other structures or organs. What happens before the procedure? Medicines  Ask your health care provider about: ? Changing or stopping your regular medicines. This is especially important if you are taking diabetes medicines or blood thinners. ? Taking medicines such as aspirin and ibuprofen. These medicines can thin your blood. Do not take these medicines unless your health care provider tells you to take them. ? Taking over-the-counter medicines, vitamins, herbs, and supplements. Staying hydrated Follow instructions from your health care provider about hydration, which may include:  Up to 2 hours before the procedure - you may continue to drink clear liquids, such as water, clear fruit juice, black coffee, and plain tea. Eating and drinking restrictions Follow instructions from your health care provider about eating and drinking, which may include:  8 hours before the procedure - stop eating heavy meals or foods, such as meat, fried foods, or fatty foods.  6 hours before the procedure - stop eating light meals or foods, such as toast or cereal.  6 hours before the procedure - stop drinking milk or drinks that contain milk.  2 hours before the procedure - stop drinking clear liquids. General instructions  Do not use any products that contain nicotine or tobacco for at least 4 weeks before the procedure. These products include cigarettes, e-cigarettes, and chewing tobacco. If you need help quitting, ask your health care provider.  You may have an exam or tests, such as: ? An electrocardiogram (ECG). ? A blood or urine test.  Ask your health care provider what steps will be taken to help prevent infection. These may include: ? Removing hair at the surgery site. ? Washing skin with a germ-killing soap. ? Taking antibiotic medicine.  Plan to have someone take you home from the hospital or clinic.  If you will  be going home right after the procedure, plan to have someone with you for 24 hours. What happens during the procedure?  An IV will be inserted into one of your veins.  You will be given one or more of the following: ? A medicine to help you relax (sedative). ? A medicine to make you fall asleep (general anesthetic).  A thin tube (catheter) may be inserted through your urethra and into your bladder to drain urine during your procedure.  Depending on the type of procedure you are having, one incision or several small incisions will be made in your abdomen.  Your fallopian  tube will be cut and removed from where it attaches to your uterus.  Your blood vessels will be clamped and tied to prevent excess bleeding.  The incisions in your abdomen will be closed with stitches (sutures), staples, or skin glue.  A bandage (dressing) may be placed over your incisions. The procedure may vary among health care providers and hospitals. What happens after the procedure?   Your blood pressure, heart rate, breathing rate, and blood oxygen level will be monitored until you leave the hospital.  You may continue to receive fluids and medicines through an IV.  You may continue to have a catheter draining your urine.  You may have to wear compression stockings. These stockings help to prevent blood clots and reduce swelling in your legs.  You will be given pain medicine as needed.  Do not drive for 24 hours if you were given a sedative during your procedure. Summary  Salpingectomy is a surgical procedure to remove one of the fallopian tubes.  The procedure may be done with an open incision, a laparoscope, or computer-controlled instruments.  Depending on the type of procedure you are having, one incision or several small incisions will be made in your abdomen.  Your blood pressure, heart rate, breathing rate, and blood oxygen level will be monitored until you leave the hospital.  Plan to have  someone take you home from the hospital or clinic. This information is not intended to replace advice given to you by your health care provider. Make sure you discuss any questions you have with your health care provider. Document Revised: 11/09/2018 Document Reviewed: 11/09/2018 Elsevier Patient Education  [employer].  Hysteroscopy Hysteroscopy is a procedure that is used to examine the inside of a woman's womb (uterus). This may be done for various reasons, including:  To look for lumps (tumors) and other growths in the uterus.  To evaluate abnormal bleeding, fibroid tumors, polyps, scar tissue (adhesions), or cancer of the uterus.  To determine the cause of an inability to get pregnant (infertility) or repeated losses of pregnancies (miscarriages).  To find a lost IUD (intrauterine device).  To perform a procedure that permanently prevents pregnancy (sterilization). During this procedure, a thin, flexible tube with a small light and camera (hysteroscope) is used to examine the uterus. The camera sends images to a monitor in the room so that your health care provider can view the inside of your uterus. A hysteroscopy should be done right after a menstrual period to make sure that you are not pregnant. Tell a health care provider about:  Any allergies you have.  All medicines you are taking, including vitamins, herbs, eye drops, creams, and over-the-counter medicines.  Any problems you or family members have had with the use of anesthetic medicines.  Any blood disorders you have.  Any surgeries you have had.  Any medical conditions you have.  Whether you are pregnant or may be pregnant. What are the risks? Generally, this is a safe procedure. However, problems may occur, including:  Excessive bleeding.  Infection.  Damage to the uterus or other structures or organs.  Allergic reaction to medicines or fluids that are used in the procedure. What happens before the  procedure? Staying hydrated Follow instructions from your health care provider about hydration, which may include:  Up to 2 hours before the procedure - you may continue to drink clear liquids, such as water, clear fruit juice, black coffee, and plain tea. Eating and drinking restrictions Follow instructions from  your health care provider about eating and drinking, which may include:  8 hours before the procedure - stop eating solid foods and drink clear liquids only  2 hours before the procedure - stop drinking clear liquids. General instructions  Ask your health care provider about: ? Changing or stopping your normal medicines. This is important if you take diabetes medicines or blood thinners. ? Taking medicines such as aspirin and ibuprofen. These medicines can thin your blood and cause bleeding. Do not take these medicines for 1 week before your procedure, or as told by your health care provider.  Do not use any products that contain nicotine or tobacco for 2 weeks before the procedure. This includes cigarettes and e-cigarettes. If you need help quitting, ask your health care provider.  Medicine may be placed in your cervix the day before the procedure. This medicine causes the cervix to have a larger opening (dilate). The larger opening makes it easier for the hysteroscope to be inserted into the uterus during the procedure.  Plan to have someone with you for the first 24-48 hours after the procedure, especially if you are given a medicine to make you fall asleep (general anesthetic).  Plan to have someone take you home from the hospital or clinic. What happens during the procedure?  To lower your risk of infection: ? Your health care team will wash or sanitize their hands. ? Your skin will be washed with soap. ? Hair may be removed from the surgical area.  An IV tube will be inserted into one of your veins.  You may be given one or more of the following: ? A medicine to help  you relax (sedative). ? A medicine that numbs the area around the cervix (local anesthetic). ? A medicine to make you fall asleep (general anesthetic).  A hysteroscope will be inserted through your vagina and into your uterus.  Air or fluid will be used to enlarge your uterus, enabling your health care provider to see your uterus better. The amount of fluid used will be carefully checked throughout the procedure.  In some cases, tissue may be gently scraped from inside the uterus and sent to a lab for testing (biopsy). The procedure may vary among health care providers and hospitals. What happens after the procedure?  Your blood pressure, heart rate, breathing rate, and blood oxygen level will be monitored until the medicines you were given have worn off.  You may have some cramping. You may be given medicines for this.  You may have bleeding, which varies from light spotting to menstrual-like bleeding. This is normal.  If you had a biopsy done, it is your responsibility to get the results of your procedure. Ask your health care provider, or the department performing the procedure, when your results will be ready. Summary  Hysteroscopy is a procedure that is used to examine the inside of a woman's womb (uterus).  After the procedure, you may have bleeding, which varies from light spotting to menstrual-like bleeding. This is normal. You may also have cramping.  Plan to have someone take you home from the hospital or clinic. This information is not intended to replace advice given to you by your health care provider. Make sure you discuss any questions you have with your health care provider. Document Revised: 10/31/2017 Document Reviewed: 12/17/2016 Elsevier Patient Education  [place] Anesthesia, Adult General anesthesia is the use of medicines to make a person "go to sleep" (unconscious) for a medical  procedure. General anesthesia must be used for certain  procedures, and is often recommended for procedures that:  Last a long time.  Require you to be still or in an unusual position.  Are major and can cause blood loss. The medicines used for general anesthesia are called general anesthetics. As well as making you unconscious for a certain amount of time, these medicines:  Prevent pain.  Control your blood pressure.  Relax your muscles. Tell a health care provider about:  Any allergies you have.  All medicines you are taking, including vitamins, herbs, eye drops, creams, and over-the-counter medicines.  Any problems you or family members have had with anesthetic medicines.  Types of anesthetics you have had in the past.  Any blood disorders you have.  Any surgeries you have had.  Any medical conditions you have.  Any recent upper respiratory, chest, or ear infections.  Any history of: ? Heart or lung conditions, such as heart failure, sleep apnea, asthma, or chronic obstructive pulmonary disease (COPD). ? [employer] logistics/support/administrative officer. ? Depression or anxiety.  Any tobacco or drug use, including marijuana or alcohol use.  Whether you are pregnant or may be pregnant. What are the risks? Generally, this is a safe procedure. However, problems may occur, including:  Allergic reaction.  Lung and heart problems.  Inhaling food or liquid from the stomach into the lungs (aspiration).  Nerve injury.  Dental injury.  Air in the bloodstream, which can lead to stroke.  Extreme agitation or confusion (delirium) when you wake up from the anesthetic.  Waking up during your procedure and being unable to move. This is rare. These problems are more likely to develop if you are having a major surgery or if you have an advanced or serious medical condition. You can prevent some of these complications by answering all of your health care provider's questions thoroughly and by following all instructions before your procedure. General anesthesia  can cause side effects, including:  Nausea or vomiting.  A sore throat from the breathing tube.  Hoarseness.  Wheezing or coughing.  Shaking chills.  Tiredness.  Body aches.  Anxiety.  Sleepiness or drowsiness.  Confusion or agitation. What happens before the procedure? Staying hydrated Follow instructions from your health care provider about hydration, which may include:  Up to 2 hours before the procedure - you may continue to drink clear liquids, such as water, clear fruit juice, black coffee, and plain tea.  Eating and drinking restrictions Follow instructions from your health care provider about eating and drinking, which may include:  8 hours before the procedure - stop eating heavy meals or foods such as meat, fried foods, or fatty foods.  6 hours before the procedure - stop eating light meals or foods, such as toast or cereal.  6 hours before the procedure - stop drinking milk or drinks that contain milk.  2 hours before the procedure - stop drinking clear liquids. Medicines Ask your health care provider about:  Changing or stopping your regular medicines. This is especially important if you are taking diabetes medicines or blood thinners.  Taking medicines such as aspirin and ibuprofen. These medicines can thin your blood. Do not take these medicines unless your health care provider tells you to take them.  Taking over-the-counter medicines, vitamins, herbs, and supplements. Do not take these during the week before your procedure unless your health care provider approves them. General instructions  Starting 3-6 weeks before the procedure, do not use any products that contain nicotine  or tobacco, such as cigarettes and e-cigarettes. If you need help quitting, ask your health care provider.  If you brush your teeth on the morning of the procedure, make sure to spit out all of the toothpaste.  Tell your health care provider if you become ill or develop a cold,  cough, or fever.  If instructed by your health care provider, bring your sleep apnea device with you on the day of your surgery (if applicable).  Ask your health care provider if you will be going home the same day, the following day, or after a longer hospital stay. ? Plan to have someone take you home from the hospital or clinic. ? Plan to have a responsible adult care for you for at least 24 hours after you leave the hospital or clinic. This is important. What happens during the procedure?   You will be given anesthetics through both of the following: ? A mask placed over your nose and mouth. ? An IV in one of your veins.  You may receive a medicine to help you relax (sedative).  After you are unconscious, a breathing tube may be inserted down your throat to help you breathe. This will be removed before you wake up.  An anesthesia specialist will stay with you throughout your procedure. He or she will: ? Keep you comfortable and safe by continuing to give you medicines and adjusting the amount of medicine that you get. ? Monitor your blood pressure, pulse, and oxygen levels to make sure that the anesthetics do not cause any problems. The procedure may vary among health care providers and hospitals. What happens after the procedure?  Your blood pressure, temperature, heart rate, breathing rate, and blood oxygen level will be monitored until the medicines you were given have worn off.  You will wake up in a recovery area. You may wake up slowly.  If you feel anxious or agitated, you may be given medicine to help you calm down.  If you will be going home the same day, your health care provider may check to make sure you can walk, drink, and urinate.  Your health care provider will treat any pain or side effects you have before you go home.  Do not drive for 24 hours if you were given a sedative. Summary  General anesthesia is used to keep you still and prevent pain during a  procedure.  It is important to tell your health care provider about your medical history and any surgeries you have had, and previous experience with anesthesia.  Follow your health care provider's instructions about when to stop eating, drinking, or taking certain medicines before your procedure.  Plan to have someone take you home from the hospital or clinic. This information is not intended to replace advice given to you by your health care provider. Make sure you discuss any questions you have with your health care provider. Document Revised: 04/07/2018 Document Reviewed: 07/04/2017 Elsevier Patient Education  [place].  How to Use Chlorhexidine for Bathing Chlorhexidine gluconate (CHG) is a germ-killing (antiseptic) solution that is used to clean the skin. It can get rid of the bacteria that normally live on the skin and can keep them away for about 24 hours. To clean your skin with CHG, you may be given:  A CHG solution to use in the shower or as part of a sponge bath.  A prepackaged cloth that contains CHG. Cleaning your skin with CHG may help lower the risk for  infection:  While you are staying in the intensive care unit of the hospital.  If you have a vascular access, such as a central line, to provide short-term or long-term access to your veins.  If you have a catheter to drain urine from your bladder.  If you are on a ventilator. A ventilator is a machine that helps you breathe by moving air in and out of your lungs.  After surgery. What are the risks? Risks of using CHG include:  A skin reaction.  Hearing loss, if CHG gets in your ears.  Eye injury, if CHG gets in your eyes and is not rinsed out.  The CHG product catching fire. Make sure that you avoid smoking and flames after applying CHG to your skin. Do not use CHG:  If you have a chlorhexidine allergy or have previously reacted to chlorhexidine.  On babies younger than 53 months of age. How to use  CHG solution  Use CHG only as told by your health care provider, and follow the instructions on the label.  Use the full amount of CHG as directed. Usually, this is one bottle. During a shower Follow these steps when using CHG solution during a shower (unless your health care provider gives you different instructions): 1. Start the shower. 2. Use your normal soap and shampoo to wash your face and hair. 3. Turn off the shower or move out of the shower stream. 4. Pour the CHG onto a clean washcloth. Do not use any type of brush or rough-edged sponge. 5. Starting at your neck, lather your body down to your toes. Make sure you follow these instructions: ? If you will be having surgery, pay special attention to the part of your body where you will be having surgery. Scrub this area for at least 1 minute. ? Do not use CHG on your head or face. If the solution gets into your ears or eyes, rinse them well with water. ? Avoid your genital area. ? Avoid any areas of skin that have broken skin, cuts, or scrapes. ? Scrub your back and under your arms. Make sure to wash skin folds. 6. Let the lather sit on your skin for 1-2 minutes or as long as told by your health care provider. 7. Thoroughly rinse your entire body in the shower. Make sure that all body creases and crevices are rinsed well. 8. Dry off with a clean towel. Do not put any substances on your body afterward--such as powder, lotion, or perfume--unless you are told to do so by your health care provider. Only use lotions that are recommended by the manufacturer. 9. Put on clean clothes or pajamas. 10. If it is the night before your surgery, sleep in clean sheets.  During a sponge bath Follow these steps when using CHG solution during a sponge bath (unless your health care provider gives you different instructions): 1. Use your normal soap and shampoo to wash your face and hair. 2. Pour the CHG onto a clean washcloth. 3. Starting at your neck,  lather your body down to your toes. Make sure you follow these instructions: ? If you will be having surgery, pay special attention to the part of your body where you will be having surgery. Scrub this area for at least 1 minute. ? Do not use CHG on your head or face. If the solution gets into your ears or eyes, rinse them well with water. ? Avoid your genital area. ? Avoid any areas of skin  that have broken skin, cuts, or scrapes. ? Scrub your back and under your arms. Make sure to wash skin folds. 4. Let the lather sit on your skin for 1-2 minutes or as long as told by your health care provider. 5. Using a different clean, wet washcloth, thoroughly rinse your entire body. Make sure that all body creases and crevices are rinsed well. 6. Dry off with a clean towel. Do not put any substances on your body afterward--such as powder, lotion, or perfume--unless you are told to do so by your health care provider. Only use lotions that are recommended by the manufacturer. 7. Put on clean clothes or pajamas. 8. If it is the night before your surgery, sleep in clean sheets. How to use CHG prepackaged cloths  Only use CHG cloths as told by your health care provider, and follow the instructions on the label.  Use the CHG cloth on clean, dry skin.  Do not use the CHG cloth on your head or face unless your health care provider tells you to.  When washing with the CHG cloth: ? Avoid your genital area. ? Avoid any areas of skin that have broken skin, cuts, or scrapes. Before surgery Follow these steps when using a CHG cloth to clean before surgery (unless your health care provider gives you different instructions): 1. Using the CHG cloth, vigorously scrub the part of your body where you will be having surgery. Scrub using a back-and-forth motion for 3 minutes. The area on your body should be completely wet with CHG when you are done scrubbing. 2. Do not rinse. Discard the cloth and let the area air-dry. Do  not put any substances on the area afterward, such as powder, lotion, or perfume. 3. Put on clean clothes or pajamas. 4. If it is the night before your surgery, sleep in clean sheets.  For general bathing Follow these steps when using CHG cloths for general bathing (unless your health care provider gives you different instructions). 1. Use a separate CHG cloth for each area of your body. Make sure you wash between any folds of skin and between your fingers and toes. Wash your body in the following order, switching to a new cloth after each step: ? The front of your neck, shoulders, and chest. ? Both of your arms, under your arms, and your hands. ? Your stomach and groin area, avoiding the genitals. ? Your right leg and foot. ? Your left leg and foot. ? The back of your neck, your back, and your buttocks. 2. Do not rinse. Discard the cloth and let the area air-dry. Do not put any substances on your body afterward--such as powder, lotion, or perfume--unless you are told to do so by your health care provider. Only use lotions that are recommended by the manufacturer. 3. Put on clean clothes or pajamas. Contact a health care provider if:  Your skin gets irritated after scrubbing.  You have questions about using your solution or cloth. Get help right away if:  Your eyes become very red or swollen.  Your eyes itch badly.  Your skin itches badly and is red or swollen.  Your hearing changes.  You have trouble seeing.  You have swelling or tingling in your mouth or throat.  You have trouble breathing.  You swallow any chlorhexidine. Summary  Chlorhexidine gluconate (CHG) is a germ-killing (antiseptic) solution that is used to clean the skin. Cleaning your skin with CHG may help to lower your risk for infection.  You may be given CHG to use for bathing. It may be in a bottle or in a prepackaged cloth to use on your skin. Carefully follow your health care provider's instructions and the  instructions on the product label.  Do not use CHG if you have a chlorhexidine allergy.  Contact your health care provider if your skin gets irritated after scrubbing. This information is not intended to replace advice given to you by your health care provider. Make sure you discuss any questions you have with your health care provider. Document Revised: 02/04/2019 Document Reviewed: 10/16/2017 Elsevier Patient Education  2020 [place] Anesthesia, Adult, Care After This sheet gives you information about how to care for yourself after your procedure. Your health care provider may also give you more specific instructions. If you have problems or questions, contact your health care provider. What can I expect after the procedure? After the procedure, the following side effects are common:  Pain or discomfort at the IV site.  Nausea.  Vomiting.  Sore throat.  Trouble concentrating.  Feeling cold or chills.  Weak or tired.  Sleepiness and fatigue.  Soreness and body aches. These side effects can affect parts of the body that were not involved in surgery. Follow these instructions at home:  For at least 24 hours after the procedure:  Have a responsible adult stay with you. It is important to have someone help care for you until you are awake and alert.  Rest as needed.  Do not: ? Participate in activities in which you could fall or become injured. ? Drive. ? Use heavy machinery. ? Drink alcohol. ? Take sleeping pills or medicines that cause drowsiness. ? Make important decisions or sign legal documents. ? Take care of children on your own. Eating and drinking  Follow any instructions from your health care provider about eating or drinking restrictions.  When you feel hungry, start by eating small amounts of foods that are soft and easy to digest (bland), such as toast. Gradually return to your regular diet.  Drink enough fluid to keep your urine pale  yellow.  If you vomit, rehydrate by drinking water, juice, or clear broth. General instructions  If you have sleep apnea, surgery and certain medicines can increase your risk for breathing problems. Follow instructions from your health care provider about wearing your sleep device: ? Anytime you are sleeping, including during daytime naps. ? While taking prescription pain medicines, sleeping medicines, or medicines that make you drowsy.  Return to your normal activities as told by your health care provider. Ask your health care provider what activities are safe for you.  Take over-the-counter and prescription medicines only as told by your health care provider.  If you smoke, do not smoke without supervision.  Keep all follow-up visits as told by your health care provider. This is important. Contact a health care provider if:  You have nausea or vomiting that does not get better with medicine.  You cannot eat or drink without vomiting.  You have pain that does not get better with medicine.  You are unable to pass urine.  You develop a skin rash.  You have a fever.  You have redness around your IV site that gets worse. Get help right away if:  You have difficulty breathing.  You have chest pain.  You have blood in your urine or stool, or you vomit blood. Summary  After the procedure, it is common to have a sore  throat or nausea. It is also common to feel tired.  Have a responsible adult stay with you for the first 24 hours after general anesthesia. It is important to have someone help care for you until you are awake and alert.  When you feel hungry, start by eating small amounts of foods that are soft and easy to digest (bland), such as toast. Gradually return to your regular diet.  Drink enough fluid to keep your urine pale yellow.  Return to your normal activities as told by your health care provider. Ask your health care provider what activities are safe for  you. This information is not intended to replace advice given to you by your health care provider. Make sure you discuss any questions you have with your health care provider. Document Revised: 11/21/2017 Document Reviewed: 07/04/2017 Elsevier Patient Education  [place].

## 2020-08-21 ENCOUNTER — Encounter (HOSPITAL_COMMUNITY): Payer: Self-pay

## 2020-08-21 ENCOUNTER — Other Ambulatory Visit: Payer: Self-pay

## 2020-08-21 ENCOUNTER — Other Ambulatory Visit (HOSPITAL_COMMUNITY)
Admission: RE | Admit: 2020-08-21 | Discharge: 2020-08-21 | Disposition: A | Discharge status: Home / Self Care | Payer: 59 | Source: Ambulatory Visit | Attending: Obstetrics and Gynecology | Admitting: Obstetrics and Gynecology

## 2020-08-21 ENCOUNTER — Encounter (HOSPITAL_COMMUNITY)
Admission: RE | Admit: 2020-08-21 | Discharge: 2020-08-21 | Disposition: A | Discharge status: Home / Self Care | Payer: 59 | Source: Ambulatory Visit | Attending: Obstetrics and Gynecology | Admitting: Obstetrics and Gynecology

## 2020-08-21 DIAGNOSIS — Z01812 Encounter for preprocedural laboratory examination: Secondary | ICD-10-CM | POA: Insufficient documentation

## 2020-08-21 DIAGNOSIS — Z20822 Contact with and (suspected) exposure to covid-19: Secondary | ICD-10-CM | POA: Insufficient documentation

## 2020-08-21 LAB — CBC
HCT: 41.7 % (ref 36.0–46.0)
Hemoglobin: 13.1 g/dL (ref 12.0–15.0)
MCH: 28.3 pg (ref 26.0–34.0)
MCHC: 31.4 g/dL (ref 30.0–36.0)
MCV: 90.1 fL (ref 80.0–100.0)
Platelets: 210 10*3/uL (ref 150–400)
RBC: 4.63 MIL/uL (ref 3.87–5.11)
RDW: 12.6 % (ref 11.5–15.5)
WBC: 4 10*3/uL (ref 4.0–10.5)
nRBC: 0 % (ref 0.0–0.2)

## 2020-08-21 LAB — COMPREHENSIVE METABOLIC PANEL
ALT: 26 U/L (ref 0–44)
AST: 23 U/L (ref 15–41)
Albumin: 3.9 g/dL (ref 3.5–5.0)
Alkaline Phosphatase: 91 U/L (ref 38–126)
Anion gap: 9 (ref 5–15)
BUN: 12 mg/dL (ref 6–20)
CO2: 20 mmol/L — ABNORMAL LOW (ref 22–32)
Calcium: 9.2 mg/dL (ref 8.9–10.3)
Chloride: 106 mmol/L (ref 98–111)
Creatinine, Ser: 0.71 mg/dL (ref 0.44–1.00)
GFR calc Af Amer: >60 mL/min (ref >60)
GFR calc non Af Amer: >60 mL/min (ref >60)
Glucose, Bld: 104 mg/dL — ABNORMAL HIGH (ref 70–99)
Potassium: 3.8 mmol/L (ref 3.5–5.1)
Sodium: 135 mmol/L (ref 135–145)
Total Bilirubin: 0.5 mg/dL (ref 0.3–1.2)
Total Protein: 7 g/dL (ref 6.5–8.1)

## 2020-08-21 LAB — URINALYSIS, ROUTINE W REFLEX MICROSCOPIC
Bilirubin Urine: NEGATIVE
Glucose, UA: NEGATIVE mg/dL
Hgb urine dipstick: NEGATIVE
Ketones, ur: NEGATIVE mg/dL
Leukocytes,Ua: NEGATIVE
Nitrite: NEGATIVE
Protein, ur: NEGATIVE mg/dL
Specific Gravity, Urine: 1.026 (ref 1.005–1.030)
pH: 5 (ref 5.0–8.0)

## 2020-08-21 LAB — SARS CORONAVIRUS 2 (TAT 6-24 HRS): SARS Coronavirus 2: NEGATIVE

## 2020-08-21 LAB — HCG, SERUM, QUALITATIVE: Preg, Serum: NEGATIVE

## 2020-08-22 ENCOUNTER — Ambulatory Visit (HOSPITAL_COMMUNITY)
Admission: RE | Admit: 2020-08-22 | Discharge: 2020-08-22 | Disposition: A | Discharge status: Home / Self Care | Payer: 59 | Attending: Obstetrics and Gynecology | Admitting: Obstetrics and Gynecology

## 2020-08-22 ENCOUNTER — Other Ambulatory Visit: Payer: Self-pay

## 2020-08-22 ENCOUNTER — Ambulatory Visit (HOSPITAL_COMMUNITY): Payer: 59 | Admitting: Anesthesiology

## 2020-08-22 ENCOUNTER — Encounter (HOSPITAL_COMMUNITY)
Admission: RE | Disposition: A | Discharge status: Home / Self Care | Payer: Self-pay | Source: Home / Self Care | Attending: Obstetrics and Gynecology

## 2020-08-22 ENCOUNTER — Encounter (HOSPITAL_COMMUNITY): Payer: Self-pay | Admitting: Obstetrics and Gynecology

## 2020-08-22 DIAGNOSIS — Z302 Encounter for sterilization: Secondary | ICD-10-CM | POA: Insufficient documentation

## 2020-08-22 DIAGNOSIS — Z791 Long term (current) use of non-steroidal anti-inflammatories (NSAID): Secondary | ICD-10-CM | POA: Insufficient documentation

## 2020-08-22 DIAGNOSIS — Z793 Long term (current) use of hormonal contraceptives: Secondary | ICD-10-CM | POA: Insufficient documentation

## 2020-08-22 DIAGNOSIS — N920 Excessive and frequent menstruation with regular cycle: Secondary | ICD-10-CM | POA: Diagnosis not present

## 2020-08-22 DIAGNOSIS — Z87891 Personal history of nicotine dependence: Secondary | ICD-10-CM | POA: Insufficient documentation

## 2020-08-22 DIAGNOSIS — N941 Unspecified dyspareunia: Secondary | ICD-10-CM | POA: Insufficient documentation

## 2020-08-22 DIAGNOSIS — I10 Essential (primary) hypertension: Secondary | ICD-10-CM | POA: Diagnosis not present

## 2020-08-22 HISTORY — PX: LAPAROSCOPIC BILATERAL SALPINGECTOMY: SHX5889

## 2020-08-22 HISTORY — PX: DILATATION AND CURETTAGE/HYSTEROSCOPY WITH MINERVA: SHX6851

## 2020-08-22 SURGERY — DILATATION AND CURETTAGE/HYSTEROSCOPY WITH MINERVA
Anesthesia: General

## 2020-08-22 MED ORDER — SUGAMMADEX SODIUM 200 MG/2ML IV SOLN
INTRAVENOUS | Status: DC | PRN
  Administered 2020-08-22: 200 mg via INTRAVENOUS

## 2020-08-22 MED ORDER — CHLORHEXIDINE GLUCONATE 0.12 % MT SOLN
15.0000 mL | Freq: Once | OROMUCOSAL | Status: AC
  Administered 2020-08-22: 15 mL via OROMUCOSAL

## 2020-08-22 MED ORDER — BUPIVACAINE-EPINEPHRINE (PF) 0.5% -1:200000 IJ SOLN
INTRAMUSCULAR | Status: DC | PRN
  Administered 2020-08-22: 20 mL via PERINEURAL

## 2020-08-22 MED ORDER — POVIDONE-IODINE 10 % EX SWAB: 2.0000 "application " | Freq: Once | CUTANEOUS | Status: DC

## 2020-08-22 MED ORDER — PROPOFOL 10 MG/ML IV BOLUS
INTRAVENOUS | Status: AC
  Filled 2020-08-22: qty 20

## 2020-08-22 MED ORDER — LIDOCAINE 2% (20 MG/ML) 5 ML SYRINGE
INTRAMUSCULAR | Status: AC
  Filled 2020-08-22: qty 5

## 2020-08-22 MED ORDER — ONDANSETRON HCL 4 MG/2ML IJ SOLN
INTRAMUSCULAR | Status: AC
  Filled 2020-08-22: qty 2

## 2020-08-22 MED ORDER — PROPOFOL 10 MG/ML IV BOLUS
INTRAVENOUS | Status: DC | PRN
  Administered 2020-08-22: 200 mg via INTRAVENOUS

## 2020-08-22 MED ORDER — TRAMADOL HCL 50 MG PO TABS: 50.0000 mg | ORAL_TABLET | Freq: Four times a day (QID) | ORAL | 0 refills | Status: DC | PRN

## 2020-08-22 MED ORDER — 0.9 % SODIUM CHLORIDE (POUR BTL) OPTIME
TOPICAL | Status: DC | PRN
  Administered 2020-08-22: 1000 mL

## 2020-08-22 MED ORDER — MIDAZOLAM HCL 2 MG/2ML IJ SOLN
INTRAMUSCULAR | Status: DC | PRN
  Administered 2020-08-22: 2 mg via INTRAVENOUS

## 2020-08-22 MED ORDER — KETOROLAC TROMETHAMINE 30 MG/ML IJ SOLN
INTRAMUSCULAR | Status: AC
  Filled 2020-08-22: qty 1

## 2020-08-22 MED ORDER — KETOROLAC TROMETHAMINE 10 MG PO TABS: 10.0000 mg | ORAL_TABLET | Freq: Four times a day (QID) | ORAL | 0 refills | Status: DC

## 2020-08-22 MED ORDER — MEPERIDINE HCL 50 MG/ML IJ SOLN: 6.2500 mg | INTRAMUSCULAR | Status: DC | PRN

## 2020-08-22 MED ORDER — DEXAMETHASONE SODIUM PHOSPHATE 10 MG/ML IJ SOLN
INTRAMUSCULAR | Status: DC | PRN
  Administered 2020-08-22: 5 mg via INTRAVENOUS

## 2020-08-22 MED ORDER — SODIUM CHLORIDE 0.9 % IR SOLN
Status: DC | PRN
  Administered 2020-08-22: 3000 mL

## 2020-08-22 MED ORDER — MIDAZOLAM HCL 2 MG/2ML IJ SOLN
INTRAMUSCULAR | Status: AC
  Filled 2020-08-22: qty 2

## 2020-08-22 MED ORDER — FENTANYL CITRATE (PF) 250 MCG/5ML IJ SOLN
INTRAMUSCULAR | Status: AC
  Filled 2020-08-22: qty 5

## 2020-08-22 MED ORDER — LACTATED RINGERS IV SOLN: Freq: Once | INTRAVENOUS | Status: AC

## 2020-08-22 MED ORDER — LIDOCAINE HCL (CARDIAC) PF 100 MG/5ML IV SOSY
PREFILLED_SYRINGE | INTRAVENOUS | Status: DC | PRN
  Administered 2020-08-22: 80 mg via INTRAVENOUS

## 2020-08-22 MED ORDER — ROCURONIUM BROMIDE 10 MG/ML (PF) SYRINGE
PREFILLED_SYRINGE | INTRAVENOUS | Status: AC
  Filled 2020-08-22: qty 10

## 2020-08-22 MED ORDER — BUPIVACAINE-EPINEPHRINE 0.5% -1:200000 IJ SOLN
INTRAMUSCULAR | Status: DC | PRN
  Administered 2020-08-22: 8 mL

## 2020-08-22 MED ORDER — KETOROLAC TROMETHAMINE 30 MG/ML IJ SOLN
INTRAMUSCULAR | Status: DC | PRN
  Administered 2020-08-22: 30 mg via INTRAVENOUS

## 2020-08-22 MED ORDER — ROCURONIUM BROMIDE 10 MG/ML (PF) SYRINGE
PREFILLED_SYRINGE | INTRAVENOUS | Status: DC | PRN
  Administered 2020-08-22: 60 mg via INTRAVENOUS

## 2020-08-22 MED ORDER — HYDROMORPHONE HCL 1 MG/ML IJ SOLN
0.2500 mg | INTRAMUSCULAR | Status: DC | PRN
  Administered 2020-08-22: 0.5 mg via INTRAVENOUS
  Filled 2020-08-22: qty 0.5

## 2020-08-22 MED ORDER — LACTATED RINGERS IV SOLN: INTRAVENOUS | Status: DC | PRN

## 2020-08-22 MED ORDER — ONDANSETRON HCL 4 MG/2ML IJ SOLN
INTRAMUSCULAR | Status: DC | PRN
  Administered 2020-08-22: 4 mg via INTRAVENOUS

## 2020-08-22 MED ORDER — PROMETHAZINE HCL 25 MG/ML IJ SOLN: 6.2500 mg | INTRAMUSCULAR | Status: DC | PRN

## 2020-08-22 MED ORDER — FENTANYL CITRATE (PF) 250 MCG/5ML IJ SOLN
INTRAMUSCULAR | Status: DC | PRN
Start: 2020-08-22 — End: 2020-08-22
  Administered 2020-08-22: 100 ug via INTRAVENOUS
  Administered 2020-08-22: 50 ug via INTRAVENOUS
  Administered 2020-08-22: 100 ug via INTRAVENOUS

## 2020-08-22 MED ORDER — ORAL CARE MOUTH RINSE: 15.0000 mL | Freq: Once | OROMUCOSAL | Status: AC

## 2020-08-22 SURGICAL SUPPLY — 51 items
BANDAGE STRIP 1X3 FLEXIBLE (GAUZE/BANDAGES/DRESSINGS) ×3 IMPLANT
BLADE SURG SZ11 CARB STEEL (BLADE) ×3 IMPLANT
BNDG ADH 1X3 FABRIC TAN LF (GAUZE/BANDAGES/DRESSINGS) ×9 IMPLANT
BNDG ADH 3X1 STRCH TAN LF (GAUZE/BANDAGES/DRESSINGS) ×6
CATH ROBINSON RED A/P 16FR (CATHETERS) ×1 IMPLANT
CLOSURE STERI-STRIP 1/4X4 (GAUZE/BANDAGES/DRESSINGS) ×3 IMPLANT
CLOTH BEACON ORANGE TIMEOUT ST (SAFETY) ×3 IMPLANT
COVER LIGHT HANDLE STERIS (MISCELLANEOUS) ×6 IMPLANT
COVER WAND RF STERILE (DRAPES) ×3 IMPLANT
DECANTER SPIKE VIAL GLASS SM (MISCELLANEOUS) ×3 IMPLANT
DURAPREP 26ML APPLICATOR (WOUND CARE) ×3 IMPLANT
ELECT REM PT RETURN 9FT ADLT (ELECTROSURGICAL) ×3
ELECTRODE REM PT RTRN 9FT ADLT (ELECTROSURGICAL) ×2 IMPLANT
GAUZE 4X4 16PLY RFD (DISPOSABLE) ×3 IMPLANT
GAUZE SPONGE 4X4 16PLY XRAY LF (GAUZE/BANDAGES/DRESSINGS) ×3 IMPLANT
GLOVE BIOGEL PI IND STRL 7.0 (GLOVE) ×6 IMPLANT
GLOVE BIOGEL PI IND STRL 9 (GLOVE) ×2 IMPLANT
GLOVE BIOGEL PI INDICATOR 7.0 (GLOVE) ×4
GLOVE BIOGEL PI INDICATOR 9 (GLOVE) ×1
GLOVE ECLIPSE 9.0 STRL (GLOVE) ×6 IMPLANT
GOWN SPEC L3 XXLG W/TWL (GOWN DISPOSABLE) ×3 IMPLANT
GOWN STRL REUS W/TWL LRG LVL3 (GOWN DISPOSABLE) ×3 IMPLANT
HANDPIECE ABLA MINERVA ENDO (MISCELLANEOUS) ×3 IMPLANT
INST SET HYSTEROSCOPY (KITS) ×3 IMPLANT
INST SET LAPROSCOPIC GYN AP (KITS) ×3 IMPLANT
IV NS IRRIG 3000ML ARTHROMATIC (IV SOLUTION) ×3 IMPLANT
KIT TURNOVER CYSTO (KITS) ×3 IMPLANT
KIT TURNOVER KIT A (KITS) ×3 IMPLANT
MANIFOLD NEPTUNE II (INSTRUMENTS) ×3 IMPLANT
NDL HYPO 25X1 1.5 SAFETY (NEEDLE) ×2 IMPLANT
NEEDLE HYPO 25X1 1.5 SAFETY (NEEDLE) ×3 IMPLANT
NEEDLE INSUFFLATION 120MM (ENDOMECHANICALS) ×3 IMPLANT
NS IRRIG 1000ML POUR BTL (IV SOLUTION) ×3 IMPLANT
PACK PERI GYN (CUSTOM PROCEDURE TRAY) ×3 IMPLANT
PAD ARMBOARD 7.5X6 YLW CONV (MISCELLANEOUS) ×3 IMPLANT
PAD TELFA 3X4 1S STER (GAUZE/BANDAGES/DRESSINGS) ×3 IMPLANT
SET BASIN LINEN APH (SET/KITS/TRAYS/PACK) ×3 IMPLANT
SET IRRIG Y TYPE TUR BLADDER L (SET/KITS/TRAYS/PACK) ×3 IMPLANT
SET TUBE SMOKE EVAC HIGH FLOW (TUBING) ×3 IMPLANT
SHEARS HARMONIC ACE PLUS 36CM (ENDOMECHANICALS) ×3 IMPLANT
SLEEVE ENDOPATH XCEL 5M (ENDOMECHANICALS) ×3 IMPLANT
SOL ANTI FOG 6CC (MISCELLANEOUS) ×2 IMPLANT
SOLUTION ANTI FOG 6CC (MISCELLANEOUS) ×1
STRIP CLOSURE SKIN 1/4X3 (GAUZE/BANDAGES/DRESSINGS) ×3 IMPLANT
SUT VIC AB 4-0 PS2 27 (SUTURE) ×3 IMPLANT
SYR 10ML LL (SYRINGE) ×3 IMPLANT
SYR BULB IRRIG 60ML STRL (SYRINGE) ×3 IMPLANT
SYR CONTROL 10ML LL (SYRINGE) ×3 IMPLANT
TROCAR XCEL NON BLADE 8MM B8LT (ENDOMECHANICALS) ×3 IMPLANT
TROCAR XCEL NON-BLD 5MMX100MML (ENDOMECHANICALS) ×3 IMPLANT
WARMER LAPAROSCOPE (MISCELLANEOUS) ×3 IMPLANT

## 2020-08-22 NOTE — Discharge Instructions (Signed)
Endometrial Ablation  Endometrial ablation is a procedure that destroys the thin inner layer of the lining of the uterus (endometrium). This procedure may be done:  To stop heavy periods.  To stop bleeding that is causing anemia.  To control irregular bleeding.  To treat bleeding caused by small tumors (fibroids) in the endometrium.  This procedure is often an alternative to major surgery, such as removal of the uterus and cervix (hysterectomy). As a result of this procedure:  You may not be able to have children. However, if you are premenopausal (you have not gone through menopause):  You may still have a small chance of getting pregnant.  You will need to use a reliable method of birth control after the procedure to prevent pregnancy.  You may stop having a menstrual period, or you may have only a small amount of bleeding during your period. Menstruation may return several years after the procedure.  Tell a health care provider about:  Any allergies you have.  All medicines you are taking, including vitamins, herbs, eye drops, creams, and over-the-counter medicines.  Any problems you or family members have had with the use of anesthetic medicines.  Any blood disorders you have.  Any surgeries you have had.  Any medical conditions you have.  What are the risks?  Generally, this is a safe procedure. However, problems may occur, including:  A hole (perforation) in the uterus or bowel.  Infection of the uterus, bladder, or vagina.  Bleeding.  Damage to other structures or organs.  An air bubble in the lung (air embolus).  Problems with pregnancy after the procedure.  Failure of the procedure.  Decreased ability to diagnose cancer in the endometrium.  What happens before the procedure?  You will have tests of your endometrium to make sure there are no pre-cancerous cells or cancer cells present.  You may have an ultrasound of the uterus.  You may be given medicines to thin the endometrium.  Ask your health care  provider about:  Changing or stopping your regular medicines. This is especially important if you take diabetes medicines or blood thinners.  Taking medicines such as aspirin and ibuprofen. These medicines can thin your blood. Do not take these medicines before your procedure if your doctor tells you not to.  Plan to have someone take you home from the hospital or clinic.  What happens during the procedure?    You will lie on an exam table with your feet and legs supported as in a pelvic exam.  To lower your risk of infection:  Your health care team will wash or sanitize their hands and put on germ-free (sterile) gloves.  Your genital area will be washed with soap.  An IV tube will be inserted into one of your veins.  You will be given a medicine to help you relax (sedative).  A surgical instrument with a light and camera (resectoscope) will be inserted into your vagina and moved into your uterus. This allows your surgeon to see inside your uterus.  Endometrial tissue will be removed using one of the following methods:  Radiofrequency. This method uses a radiofrequency-alternating electric current to remove the endometrium.  Cryotherapy. This method uses extreme cold to freeze the endometrium.  Heated-free liquid. This method uses a heated saltwater (saline) solution to remove the endometrium.  Microwave. This method uses high-energy microwaves to heat up the endometrium and remove it.  Thermal balloon. This method involves inserting a catheter with a balloon tip into   filled with heated fluid to remove the endometrium. The procedure may vary among health care providers and hospitals. What happens after the procedure?  Your blood pressure, heart rate, breathing rate, and blood oxygen level will be monitored until the medicines you were given have worn off.  As tissue healing occurs, you may  notice vaginal bleeding for 4-6 weeks after the procedure. You may also experience: ? Cramps. ? Thin, watery vaginal discharge that is light pink or brown in color. ? A need to urinate more frequently than usual. ? Nausea.  Do not drive for 24 hours if you were given a sedative.  Do not have sex or insert anything into your vagina until your health care provider approves. Summary  Endometrial ablation is done to treat the many causes of heavy menstrual bleeding.  The procedure may be done only after medications have been tried to control the bleeding.  Plan to have someone take you home from the hospital or clinic. This information is not intended to replace advice given to you by your health care provider. Make sure you discuss any questions you have with your health care provider. Document Revised: 05/05/2018 Document Reviewed: 12/05/2016 Elsevier Patient Education  2020 [employer]     General Anesthesia, Adult, Care After This sheet gives you information about how to care for yourself after your procedure. Your health care provider may also give you more specific instructions. If you have problems or questions, contact your health care provider. What can I expect after the procedure? After the procedure, the following side effects are common:  Pain or discomfort at the IV site.  Nausea.  Vomiting.  Sore throat.  Trouble concentrating.  Feeling cold or chills.  Weak or tired.  Sleepiness and fatigue.  Soreness and body aches. These side effects can affect parts of the body that were not involved in surgery. Follow these instructions at home:  For at least 24 hours after the procedure:  Have a responsible adult stay with you. It is important to have someone help care for you until you are awake and alert.  Rest as needed.  Do not: ? Participate in activities in which you could fall or become injured. ? Drive. ? Use heavy machinery. ? Drink  alcohol. ? Take sleeping pills or medicines that cause drowsiness. ? Make important decisions or sign legal documents. ? Take care of children on your own. Eating and drinking  Follow any instructions from your health care provider about eating or drinking restrictions.  When you feel hungry, start by eating small amounts of foods that are soft and easy to digest (bland), such as toast. Gradually return to your regular diet.  Drink enough fluid to keep your urine pale yellow.  If you vomit, rehydrate by drinking water, juice, or clear broth. General instructions  If you have sleep apnea, surgery and certain medicines can increase your risk for breathing problems. Follow instructions from your health care provider about wearing your sleep device: ? Anytime you are sleeping, including during daytime naps. ? While taking prescription pain medicines, sleeping medicines, or medicines that make you drowsy.  Return to your normal activities as told by your health care provider. Ask your health care provider what activities are safe for you.  Take over-the-counter and prescription medicines only as told by your health care provider.  If you smoke, do not smoke without supervision.  Keep all follow-up visits as told by your health care provider. This is important. Contact a   health care provider if:  You have nausea or vomiting that does not get better with medicine.  You cannot eat or drink without vomiting.  You have pain that does not get better with medicine.  You are unable to pass urine.  You develop a skin rash.  You have a fever.  You have redness around your IV site that gets worse. Get help right away if:  You have difficulty breathing.  You have chest pain.  You have blood in your urine or stool, or you vomit blood. Summary  After the procedure, it is common to have a sore throat or nausea. It is also common to feel tired.  Have a responsible adult stay with you  for the first 24 hours after general anesthesia. It is important to have someone help care for you until you are awake and alert.  When you feel hungry, start by eating small amounts of foods that are soft and easy to digest (bland), such as toast. Gradually return to your regular diet.  Drink enough fluid to keep your urine pale yellow.  Return to your normal activities as told by your health care provider. Ask your health care provider what activities are safe for you. This information is not intended to replace advice given to you by your health care provider. Make sure you discuss any questions you have with your health care provider. Document Revised: 11/21/2017 Document Reviewed: 07/04/2017 Elsevier Patient Education  2020 [employer]  

## 2020-08-22 NOTE — H&P (Signed)
PATIENT ID: [name], female     DOB: 10-31-1976, 44 y.o.     MRN: [id number]   [facility] Visit  07/19/20     PATIENT NAME: Deborah Humphrey     MRN [id number]     DOB: [birth year]  CC & HPI:  No chief complaint on file.  Deborah Humphrey is a 44 y.o. female presenting today for evaluation of her heavy menses and pain on intercourse. She said that intercourse is painful and feels sharp; it feels like he's bumping into something. As for her menses, it's irregular and she has a lot of breakthrough bleeding even with contraception.   She would cramp during her menses and then have sharp shooting pains between her cycles as well. During her cycles, she would have a cycle that would last about 7-8 days, the first three of which she would bleed through both a pad and a tampon within an hour.   She has Hx of cesarean.   ROS:  Review of Systems  Constitutional: Negative.   HENT: Negative.   Eyes: Negative.   Respiratory: Negative.   Cardiovascular: Negative.   Gastrointestinal: Negative.   Endocrine: Negative.   Genitourinary: Positive for dyspareunia and menstrual problem.  Musculoskeletal: Negative.   Skin: Negative.   Allergic/Immunologic: Negative.   Neurological: Negative.   Hematological: Negative.   Psychiatric/Behavioral: Negative.   All other systems reviewed and are negative.    Pertinent History Reviewed:  Medical             Past Medical History:  Diagnosis Date  . Cervical spine fracture (HCC)    Denies havign surgery   . Helicobacter pylori (H. pylori)   . Hypertension    gestational hypertension  . Infertility, female                               Surgical Hx:         Past Surgical History:  Procedure Laterality Date  . CESAREAN SECTION     Medications: Reviewed & Updated - see associated section                       Current Outpatient Medications:  .  cyclobenzaprine (FLEXERIL) 10 MG tablet, Take 10 mg by mouth 2 (two) times  daily as needed., Disp: , Rfl:  .  diclofenac (VOLTAREN) 75 MG EC tablet, Take 75 mg by mouth 2 (two) times daily., Disp: , Rfl:  .  HYDROcodone-acetaminophen (NORCO/VICODIN) 5-325 MG tablet, Take by mouth., Disp: , Rfl:  .  ibuprofen (ADVIL) 800 MG tablet, Take by mouth., Disp: , Rfl:  .  medroxyPROGESTERone (DEPO-PROVERA) 150 MG/ML injection, Inject into the muscle., Disp: , Rfl:    Social History: Reviewed -  reports that she quit smoking about 9 years ago. Her smoking use included cigarettes. She has a 15.00 pack-year smoking history. She has never used smokeless tobacco.  Objective Findings:  Vitals: Blood pressure 132/79, pulse 87, height 5\' 3"  (1.6 m), weight 223 lb 9.6 oz (101.4 kg).  PHYSICAL EXAMINATION General appearance - alert, well appearing, and in no distress, oriented to person, place, and time, overweight and well hydrated Mental status - alert, oriented to person, place, and time, normal mood, behavior, speech, dress, motor activity, and thought processes, affect appropriate to mood Chest - not examined Heart - not examined Abdomen - not examined Breasts - not examined  Skin - normal coloration and turgor, no rashes, no suspicious skin lesions noted    GYNECOLOGIC SONOGRAM   Deborah L Priceis a 44 y.o.G1P1001 No LMP recorded. Patient has had an injection.She is here for a pelvic sonogram for menorrhagia,dysmenorrhea and dyspareunia.  Uterus 8.2 x 5.5 x 7.5 cm, Total uterine volume 176 cc, heterogeneous anteverted uterus,anterior mid/left intramural fibroid 3.1 X 2.3 X 2.3 cm  Endometrium 4.2 mm, symmetrical, wnl  Right ovary 3.3 x 2.2 x 3 cm, wnl  Left ovary 3 x 2.2 x 2.7 cm, wnl  No free fluid  Technician Comments:  US PELVIC TA/TV: heterogeneous anteverted uterus,anterior mid/left intramural fibroid 3.1 X 2.3 X 2.3 cm,EEC 4.2 mm,normal ovaries,ovaries appear mobile,no free fluid,some  discomfort during ultrasound  Chaperone [address] Heide Guile 06/30/2020 12:39 PM  Clinical Impression and recommendations:  I have reviewed the sonogram results above.   Combined with the patient's current clinical course, below are my impressions and any appropriate recommendations for management based on the sonographic findings:   FINDINGS:  I Maternal uterus: anteverted, anteflexed, with evidence of prior cesarean with low transverse uterine scar. Anterior Type 1 uterine fibroid on the anterior uterine wall which deforms the uterine endometrial stripe, extending approximately 1/3 the width of the fibroid in to the uterine endometrium view prior cesarean scar seen adnexae: Ovaries are within normal limits.  Right ovary normal mobile with vaginal probe manipulation Left ovary Normal mobile  Free Fluid n/a wq " P with   Jonnie Kind 07/07/2020     Assessment & Plan:   A:  1. Discussion: Discussed with pt risks and benefits of endometrial ablation. Advised pt that intrauterine pregnancy is nearly 100% preventable with ablation. Discussed reduced risk of ectopic pregnancy and reduced risk of ovarian cancer from 1 in 100 to 1 in 300 with bilateral salpingectomy in a lengthy conversation with risks benefits rationale and alternatives including IUD reviewed. Brochures given.   At end of discussion, pt had opportunity to ask questions and has no further questions at this time.   Specific discussion of endometrial ablation and bilateral salpingectomy as noted above.  Greater than 50% was spent in counseling and coordination of care with the patient.   Total time greater than: 20 minutes.      2. Discussion: Discussed with pt risks and benefits of BTL. Discussed using clips only vs bilateral salpingectomy. Advised pt that bilateral salpingectomy is a permanent procedure, but reduces the risk of cancer by 2/3.  At end of discussion, pt had opportunity to ask questions and has no further questions at this time.   Specific discussion of permanent sterilization as noted above. Greater than 50% was spent in counseling and coordination of care with the patient.   Total time greater than: 10 minutes.      P:   Schedule for salpingectomy and uterine ablation preop endometrial biopsy Patient given informed consent, signed copy in the chart, time out was performed. Appropriate time out taken. . The patient was placed in the lithotomy position and the cervix brought into view with sterile speculum.  Portio of cervix cleansed x 2 with betadine swabs.  A tenaculum was placed in the anterior lip of the cervix.  The uterus was sounded for depth of 9 cm. A pipelle was introduced to into the uterus, suction created,  and an endometrial sample was obtained. All equipment was removed and accounted for.  The patient tolerated the procedure well.    Patient  given post procedure instructions. The patient will return for ablation and salpingectomy as scheduled 21 Sept.2021      1. .pathology benign   By signing my name below, I, [employer], attest that this documentation has been prepared under the direction and in the presence of Jonnie Kind, MD. Electronically Signed: [place]. 07/19/20. 3:13 PM.  I personally performed the services described in this documentation, which was SCRIBED in my presence. The recorded information has been reviewed and considered accurate. It has been edited as necessary during review. Jonnie Kind,  MD  PLAN: laparoscopic bilateral salpingectomy            Hysteroscopy dilation and curettage, endometrial ablation

## 2020-08-22 NOTE — Brief Op Note (Addendum)
08/22/2020  11:39 AM  PATIENT:  Deborah Humphrey  44 y.o. female  PRE-OPERATIVE DIAGNOSIS:  Menorrhagia Steriilzation  POST-OPERATIVE DIAGNOSIS:  Menorrhagia Sterilzation  PROCEDURE:  Procedure(s): DILATATION AND CURETTAGE/HYSTEROSCOPY WITH MINERVA (N/A) LAPAROSCOPIC BILATERAL SALPINGECTOMY (Bilateral)  SURGEON:  Surgeon(s) and Role:    Jonnie Kind, MD - Primary  PHYSICIAN ASSISTANT:   ASSISTANTS: Zoila Shutter, CST   ANESTHESIA:   local, general and paracervical block  EBL:  10 mL   BLOOD ADMINISTERED:none  DRAINS: none   LOCAL MEDICATIONS USED:  MARCAINE    and Amount: 20 ml  SPECIMEN:  Source of Specimen:  Bilateral fallopian tubes  DISPOSITION OF SPECIMEN:  PATHOLOGY  COUNTS:  YES  TOURNIQUET:  * No tourniquets in log *  DICTATION: .Dragon Dictation  PLAN OF CARE: Discharge to home after PACU  PATIENT DISPOSITION:  PACU - hemodynamically stable.   Delay start of Pharmacological VTE agent (>24hrs) due to surgical blood loss or risk of bleeding: not applicable Details of procedure: Patient was taken to the operating room prepped and draped for combined abdominal and vaginal procedure.  The timeout was conducted by the surgical team and procedure confirmed. After timeout, speculum was inserted the cervix grasped with single-tooth tenaculum and Hulka uterine manipulator, the bladder had been in and out catheterized prior to this.. Attention was then directed to the abdomen, and after glove changes were made, an infraumbilical vertical 1 cm skin incision was made as well as a transverse suprapubic incision of similar length.  A right lower quadrant incision of 1 cm length was made as well.  Hemostat was used to enter the subcutaneous fat just below the umbilicus made and staying close to the umbilical stalk, Veress needle was used to enter the abdominal cavity or water droplet technique was used to confirm intraperitoneal free flow of fluid.  The pneumoperitoneum was  achieved under 8 mmHg pressure, then the 5 mm laparoscopic trocar introduced through the umbilicus.  The abdomen was inspected and was confirmed as hemostatic and there was no suspicion of injury associated with peritoneal entry.  The suprapubic trocar was inserted under direct visualization as well as the right lower quadrant trocar.  Attention was then directed to the pelvis.  With the patient in Trendelenburg position the left fallopian tube could be identified to its fimbriated end.  Photos were taken to document tissue status the left fallopian tube was amputated off the mesosalpinx using a harmonic Ace 7 device and the specimen was extracted through the suprapubic 8 mm port.  Right fallopian tube was treated similarly.  Both fallopian tubes were extracted.  The ovaries were grossly normal.  The pelvis was hemostatic.  60 cc of saline was instilled into the abdomen and then the laparoscopic trocar sites removed and subcuticular 4-0 Vicryl used to close all 3 skin incisions followed by Steri-Strips and Band-Aids. Physician then relocated to the pelvic area for sitting the speculum was inserted the Hulka uterine manipulator removed, paracervical block applied using 30 cc of 0.5% Marcaine. The uterus was sounded to 9.5 cm, dilated to 23 [place] allowing introduction of the hysteroscope 30 degree scope, and the visualization of the tubal ostia was completed Curettage obtain minimal tissue. No tissue was available for submission to pathology.. The hysteroscope was then removed, and the Minerva endometrial ablation device prepared inserted activated and in standard fashion performing a 5.5 cm endometrial ablation.  Post procedure visualization of the endometrial cavity showed appropriate char effect the full length of the  endometrial cavity.  Patient tolerated procedure well and went to recovery room in good condition sponge and needle counts were correct.

## 2020-08-22 NOTE — Op Note (Signed)
08/22/2020  11:39 AM  PATIENT:  Deborah Humphrey  44 y.o. female  PRE-OPERATIVE DIAGNOSIS:  Menorrhagia Steriilzation  POST-OPERATIVE DIAGNOSIS:  Menorrhagia Sterilzation  PROCEDURE:  Procedure(s): DILATATION AND CURETTAGE/HYSTEROSCOPY WITH MINERVA (N/A) LAPAROSCOPIC BILATERAL SALPINGECTOMY (Bilateral)  SURGEON:  Surgeon(s) and Role:    Jonnie Kind, MD - Primary  PHYSICIAN ASSISTANT:   ASSISTANTS: Zoila Shutter, CST   ANESTHESIA:   local, general and paracervical block  EBL:  10 mL   BLOOD ADMINISTERED:none  DRAINS: none   LOCAL MEDICATIONS USED:  MARCAINE    and Amount: 20 ml  SPECIMEN:  Source of Specimen:  Bilateral fallopian tubes  DISPOSITION OF SPECIMEN:  PATHOLOGY  COUNTS:  YES  TOURNIQUET:  * No tourniquets in log *  DICTATION: .Dragon Dictation  PLAN OF CARE: Discharge to home after PACU  PATIENT DISPOSITION:  PACU - hemodynamically stable.   Delay start of Pharmacological VTE agent (>24hrs) due to surgical blood loss or risk of bleeding: not applicable Details of procedure: Patient was taken to the operating room prepped and draped for combined abdominal and vaginal procedure.  The timeout was conducted by the surgical team and procedure confirmed. After timeout, speculum was inserted the cervix grasped with single-tooth tenaculum and Hulka uterine manipulator, the bladder had been in and out catheterized prior to this.. Attention was then directed to the abdomen, and after glove changes were made, an infraumbilical vertical 1 cm skin incision was made as well as a transverse suprapubic incision of similar length.  A right lower quadrant incision of 1 cm length was made as well.  Hemostat was used to enter the subcutaneous fat just below the umbilicus made and staying close to the umbilical stalk, Veress needle was used to enter the abdominal cavity or water droplet technique was used to confirm intraperitoneal free flow of fluid.  The pneumoperitoneum was  achieved under 8 mmHg pressure, then the 5 mm laparoscopic trocar introduced through the umbilicus.  The abdomen was inspected and was confirmed as hemostatic and there was no suspicion of injury associated with peritoneal entry.  The suprapubic trocar was inserted under direct visualization as well as the right lower quadrant trocar.  Attention was then directed to the pelvis.  With the patient in Trendelenburg position the left fallopian tube could be identified to its fimbriated end.  Photos were taken to document tissue status the left fallopian tube was amputated off the mesosalpinx using a harmonic Ace 7 device and the specimen was extracted through the suprapubic 8 mm port.  Right fallopian tube was treated similarly.  Both fallopian tubes were extracted.  The ovaries were grossly normal.  The pelvis was hemostatic.  60 cc of saline was instilled into the abdomen and then the laparoscopic trocar sites removed and subcuticular 4-0 Vicryl used to close all 3 skin incisions followed by Steri-Strips and Band-Aids. Physician then relocated to the pelvic area for sitting the speculum was inserted the Hulka uterine manipulator removed, paracervical block applied using 30 cc of 0.5% Marcaine. The uterus was sounded to 9.5 cm, dilated to 23 [place] allowing introduction of the hysteroscope 30 degree scope, and the visualization of the tubal ostia was completed Curettage obtain minimal tissue. No tissue was available for submission to pathology.. The hysteroscope was then removed, and the Minerva endometrial ablation device prepared inserted activated and in standard fashion performing a 5.5 cm endometrial ablation.  Post procedure visualization of the endometrial cavity showed appropriate char effect the full length of the  endometrial cavity.  Patient tolerated procedure well and went to recovery room in good condition sponge and needle counts were correct.

## 2020-08-22 NOTE — Anesthesia Procedure Notes (Signed)
Procedure Name: Intubation Date/Time: 08/22/2020 10:19 AM Performed by: Raenette Rover, CRNA Pre-anesthesia Checklist: Patient identified, Emergency Drugs available, Suction available and Patient being monitored Patient Re-evaluated:Patient Re-evaluated prior to induction Oxygen Delivery Method: Circle system utilized Preoxygenation: Pre-oxygenation with 100% oxygen Induction Type: IV induction Ventilation: Mask ventilation without difficulty Laryngoscope Size: Mac and 3 Grade View: Grade I Tube type: Oral Tube size: 7.0 mm Number of attempts: 1 Airway Equipment and Method: Stylet Placement Confirmation: ETT inserted through vocal cords under direct vision,  positive ETCO2 and breath sounds checked- equal and bilateral Secured at: 21 cm Tube secured with: Tape Dental Injury: Teeth and Oropharynx as per pre-operative assessment

## 2020-08-22 NOTE — Transfer of Care (Signed)
Immediate Anesthesia Transfer of Care Note  Patient: Deborah Humphrey  Procedure(s) Performed: DILATATION AND CURETTAGE/HYSTEROSCOPY WITH MINERVA (N/A ) LAPAROSCOPIC BILATERAL SALPINGECTOMY (Bilateral )  Patient Location: PACU  Anesthesia Type:General  Level of Consciousness: awake, alert , oriented and patient cooperative  Airway & Oxygen Therapy: Patient Spontanous Breathing  Post-op Assessment: Report given to RN and Post -op Vital signs reviewed and stable  Post vital signs: Reviewed and stable  Last Vitals:  Vitals Value Taken Time  BP 126/87   Temp    Pulse 96 08/22/20 1135  Resp 18 08/22/20 1135  SpO2 99 % 08/22/20 1135  Vitals shown include unvalidated device data.  Last Pain:  Vitals:   08/22/20 0859  TempSrc: Oral  PainSc: 0-No pain         Complications: No complications documented.

## 2020-08-22 NOTE — Anesthesia Postprocedure Evaluation (Signed)
Anesthesia Post Note  Patient: STEVANA DUFNER  Procedure(s) Performed: DILATATION AND CURETTAGE/HYSTEROSCOPY WITH MINERVA (N/A ) LAPAROSCOPIC BILATERAL SALPINGECTOMY (Bilateral )  Patient location during evaluation: PACU Anesthesia Type: General Level of consciousness: awake, awake and alert, oriented and patient cooperative Pain management: pain level controlled Vital Signs Assessment: post-procedure vital signs reviewed and stable Respiratory status: spontaneous breathing, nonlabored ventilation and respiratory function stable Cardiovascular status: blood pressure returned to baseline and stable Postop Assessment: no apparent nausea or vomiting Anesthetic complications: no   No complications documented.   Last Vitals:  Vitals:   08/22/20 0859 08/22/20 1135  BP: 121/75 126/87  Pulse: 78   Resp: 20   Temp: 37.1 C 36.8 C  SpO2: 98%     Last Pain:  Vitals:   08/22/20 1135  TempSrc:   PainSc: 0-No pain                 Eliyas Suddreth L

## 2020-08-22 NOTE — Anesthesia Preprocedure Evaluation (Signed)
Anesthesia Evaluation  Patient identified by MRN, date of birth, ID band Patient awake    Reviewed: Allergy & Precautions, NPO status , Patient's Chart, lab work & pertinent test results  History of Anesthesia Complications Negative for: history of anesthetic complications  Airway Mallampati: II  TM Distance: >3 FB Neck ROM: Full    Dental  (+) Upper Dentures, Missing, Dental Advisory Given   Pulmonary former smoker,    breath sounds clear to auscultation       Cardiovascular Exercise Tolerance: Good hypertension, Pt. on medications Normal cardiovascular exam Rhythm:Regular Rate:Normal     Neuro/Psych PSYCHIATRIC DISORDERS negative neurological ROS     GI/Hepatic negative GI ROS, (+)     substance abuse  alcohol use,   Endo/Other  negative endocrine ROS  Renal/GU negative Renal ROS     Musculoskeletal Cervical spine fx   Abdominal   Peds  Hematology negative hematology ROS (+)   Anesthesia Other Findings   Reproductive/Obstetrics                             Anesthesia Physical Anesthesia Plan  ASA: II  Anesthesia Plan: General   Post-op Pain Management:    Induction: Intravenous  PONV Risk Score and Plan: 4 or greater and Ondansetron, Dexamethasone and Midazolam  Airway Management Planned: Oral ETT  Additional Equipment:   Intra-op Plan:   Post-operative Plan: Extubation in OR  Informed Consent: I have reviewed the patients History and Physical, chart, labs and discussed the procedure including the risks, benefits and alternatives for the proposed anesthesia with the patient or authorized representative who has indicated his/her understanding and acceptance.     Dental advisory given  Plan Discussed with: CRNA and Surgeon  Anesthesia Plan Comments:         Anesthesia Quick Evaluation

## 2020-08-23 ENCOUNTER — Encounter (HOSPITAL_COMMUNITY): Payer: Self-pay | Admitting: Obstetrics and Gynecology

## 2020-08-23 LAB — SURGICAL PATHOLOGY

## 2020-08-28 ENCOUNTER — Telehealth: Payer: Self-pay

## 2020-08-28 ENCOUNTER — Telehealth: Payer: Self-pay | Admitting: Obstetrics and Gynecology

## 2020-08-28 NOTE — Telephone Encounter (Signed)
Pt had surgery on Tuesday. Had bleeding for 24 hours. Bleeding stopped. Had clear discharge afterwards. Yesterday am, she felt a warm sensation and had soaked through a pad. Started to get lighter in color but still has periods of gushing blood. Not having much pain. Having some cramping. + nausea today and yesterday. I spoke with Tish, RN. Pt can see Dr. Elonda Husky tomorrow at 10:10. No doc in office today. Pt voiced understanding. [place]

## 2020-08-28 NOTE — Telephone Encounter (Signed)
F/u appoint from surgical procedure 08/29/20. Pt has a big amount discharge. It was red yesterday then turned to pink back to red. Pt having gushing sensations pt soaking through pad pants and underwear. No odor pt asking for a return call.

## 2020-08-28 NOTE — Telephone Encounter (Signed)
Phone call to pt confirms that the d/c is watery, without malodor, or fever, so pt seems fine. Will be seen tomorrow.Marland Kitchen

## 2020-08-29 ENCOUNTER — Ambulatory Visit (INDEPENDENT_AMBULATORY_CARE_PROVIDER_SITE_OTHER): Payer: 59 | Admitting: Obstetrics and Gynecology

## 2020-08-29 ENCOUNTER — Encounter: Payer: Self-pay | Admitting: Obstetrics and Gynecology

## 2020-08-29 VITALS — BP 128/81 | HR 89 | Ht 63.0 in | Wt 221.8 lb

## 2020-08-29 DIAGNOSIS — Z09 Encounter for follow-up examination after completed treatment for conditions other than malignant neoplasm: Secondary | ICD-10-CM

## 2020-08-29 NOTE — Progress Notes (Signed)
   Subjective:  Deborah Humphrey is a 44 y.o. female now 1 weeks status post laparoscopic salpingectomy, and endometrial ablation. She had lite watery d/c yesterday with some blood.    see note from yesterday phone calls. Review of Systems Negative except watery d/c   Diet:   reg   Bowel movements : constipated by opiates postop.  has quit all med  Objective:  BP 128/81 (BP Location: Right Arm, Patient Position: Sitting, Cuff Size: Large)   Pulse 89   Ht 5\' 3"  (1.6 m)   Wt 221 lb 12.8 oz (100.6 kg)   BMI 39.29 kg/m  General:Well developed, well nourished.  No acute distress. Abdomen: Bowel sounds normal, soft, non-tender. Pelvic Exam:    External Genitalia:  Normal.    Vagina: Normal    Cervix: Normalwatery discharge, nonpurulent    Uterus: Normal    Adnexa/Bimanual: Normal  Incision(s): N/A  \ Healing well at abdomen slight bruising     Assessment:  Post-Op 1 weeks s/p operative hysteroscopy and tubal salpingectomy   stable postoperatively.   Plan:  1.Wound care discussed   2. . current medications.none 3. Activity restrictions: no lifting more than 20 x 2wk pounds 4. return to work: now. 5. Follow up in prn prn.

## 2020-10-24 ENCOUNTER — Encounter (INDEPENDENT_AMBULATORY_CARE_PROVIDER_SITE_OTHER): Payer: Self-pay | Admitting: *Deleted

## 2020-11-14 ENCOUNTER — Other Ambulatory Visit: Payer: Self-pay

## 2020-11-14 ENCOUNTER — Ambulatory Visit (INDEPENDENT_AMBULATORY_CARE_PROVIDER_SITE_OTHER): Payer: 59 | Admitting: Gastroenterology

## 2020-11-14 ENCOUNTER — Encounter (INDEPENDENT_AMBULATORY_CARE_PROVIDER_SITE_OTHER): Payer: Self-pay | Admitting: Gastroenterology

## 2020-11-14 DIAGNOSIS — K219 Gastro-esophageal reflux disease without esophagitis: Secondary | ICD-10-CM

## 2020-11-14 DIAGNOSIS — R1319 Other dysphagia: Secondary | ICD-10-CM | POA: Diagnosis not present

## 2020-11-14 DIAGNOSIS — R1310 Dysphagia, unspecified: Secondary | ICD-10-CM | POA: Insufficient documentation

## 2020-11-14 MED ORDER — OMEPRAZOLE 40 MG PO CPDR: 40.0000 mg | DELAYED_RELEASE_CAPSULE | Freq: Every day | ORAL | 0 refills | Status: DC

## 2020-11-14 NOTE — Progress Notes (Signed)
Deborah Humphrey, M.D. Gastroenterology & Hepatology [facility] [address] [place], [place] [address] Primary Care Physician: Manon Hilding, MD [place] [address]  Referring MD: PCP  I will communicate my assessment and recommendations to the referring MD via EMR. Note: Occasional unusual wording and randomly placed punctuation marks may result from the use of speech recognition technology to transcribe this document"  Chief Complaint:  dysphagia  History of Present Illness: Deborah Humphrey is a 44 y.o. female with PMH anxiety and OCD, who presents for evaluation of dysphagia.  Patient reports that for the last 1.5 years she has presented episodes of dysphagia to both solids and liquids (mostly to solids), making her feel "food was getting stuck in her chest". Denies any nausea or vomiting, no episodes of food impaction or inducing vomiting to relieve her symptoms. States that the dysphagia became progressive from an episode every couple of months to happening twice each time she had a meal. She had some odynophagia that was transient. She states that she was started on Prilosec 40 mg qday as prescribed by her PCP which since then has completely resolved her symptoms. The patient denies having any nausea, vomiting, fever, chills, hematochezia, melena, hematemesis, abdominal distention, abdominal pain, diarrhea, jaundice, pruritus or weight loss.  Last EGD: 2009 - found to have H. Pylori, treated for it. No report is available. Last Colonoscopy:never  FHx: neg for any gastrointestinal/liver disease, father prostate cancer, grandfather pancreatic cancer, sisters x2 and aunt breast cancer, grandmother metastatic cancer Social: quit smoking 8 years ago, neg frequent alcohol or illicit drug use Surgical: removal of Fallopian tubes  Past Medical History: Past Medical History:  Diagnosis Date  . Cervical spine fracture (HCC)     Denies havign surgery   . Helicobacter pylori (H. pylori)   . Hypertension    gestational hypertension  . Infertility, female     Past Surgical History: Past Surgical History:  Procedure Laterality Date  . CESAREAN SECTION    . DILATATION AND CURETTAGE/HYSTEROSCOPY WITH MINERVA N/A 08/22/2020   Procedure: DILATATION AND CURETTAGE/HYSTEROSCOPY WITH MINERVA;  Surgeon: Jonnie Kind, MD;  Location: [facility];  Service: Gynecology;  Laterality: N/A;  . DILATION AND CURETTAGE OF UTERUS     x3  . HALO APPLICATION     age 12  . LAPAROSCOPIC BILATERAL SALPINGECTOMY Bilateral 08/22/2020   Procedure: LAPAROSCOPIC BILATERAL SALPINGECTOMY;  Surgeon: Jonnie Kind, MD;  Location: [facility];  Service: Gynecology;  Laterality: Bilateral;    Family History: Family History  Problem Relation Age of Onset  . Cancer Paternal Grandfather        pancreatic?  Marland Kitchen Cancer Paternal Grandmother        liver  . Dementia Maternal Grandmother   . Cancer Father        pancreatic  . Cancer Mother        skin  . Breast cancer Sister   . Polycystic ovary syndrome Sister   . Endometriosis Sister   . Breast cancer Sister   . Breast cancer Paternal Aunt     Social History: Social History   Tobacco Use  Smoking Status Former Smoker  . Packs/day: 1.00  . Years: 15.00  . Pack years: 15.00  . Types: Cigarettes  . Quit date: 01/31/2011  . Years since quitting: 9.7  Smokeless Tobacco Never Used   Social History   Substance and Sexual Activity  Alcohol Use Yes   Comment:  occassional   Social History   Substance and Sexual Activity  Drug Use No    Allergies: Allergies  Allergen Reactions  . Bee Venom Swelling    Medications: Current Outpatient Medications  Medication Sig Dispense Refill  . acetaminophen (TYLENOL) 500 MG tablet Take 1,000 mg by mouth every 6 (six) hours as needed for moderate pain.    . fluvoxaMINE (LUVOX) 50 MG tablet Take 50 mg by mouth at bedtime.    Marland Kitchen omeprazole  (PRILOSEC) 40 MG capsule Take 40 mg by mouth daily.     No current facility-administered medications for this visit.    Review of Systems: GENERAL: negative for malaise, night sweats HEENT: No changes in hearing or vision, no nose bleeds or other nasal problems. NECK: Negative for lumps, goiter, pain and significant neck swelling RESPIRATORY: Negative for cough, wheezing CARDIOVASCULAR: Negative for chest pain, leg swelling, palpitations, orthopnea GI: SEE HPI MUSCULOSKELETAL: Negative for joint pain or swelling, back pain, and muscle pain. SKIN: Negative for lesions, rash PSYCH: Negative for sleep disturbance, mood disorder and recent psychosocial stressors. HEMATOLOGY Negative for prolonged bleeding, bruising easily, and swollen nodes. ENDOCRINE: Negative for cold or heat intolerance, polyuria, polydipsia and goiter. NEURO: negative for tremor, gait imbalance, syncope and seizures. The remainder of the review of systems is noncontributory.   Physical Exam: BP 128/81 (BP Location: Left Arm, Patient Position: Sitting, Cuff Size: Large)   Pulse 75   Temp 98.1 F (36.7 C) (Oral)   Ht 5\' 3"  (1.6 m)   Wt 223 lb (101.2 kg)   BMI 39.50 kg/m  GENERAL: The patient is AO x3, in no acute distress. Obese. HEENT: Head is normocephalic and atraumatic. EOMI are intact. Mouth is well hydrated and without lesions. NECK: Supple. No masses LUNGS: Clear to auscultation. No presence of rhonchi/wheezing/rales. Adequate chest expansion HEART: RRR, normal s1 and s2. ABDOMEN: Soft, nontender, no guarding, no peritoneal signs, and nondistended. BS +. No masses. EXTREMITIES: Without any cyanosis, clubbing, rash, lesions or edema. NEUROLOGIC: AOx3, no focal motor deficit. SKIN: no jaundice, no rashes   Imaging/Labs: as above  I personally reviewed and interpreted the available labs, imaging and endoscopic files.  Impression and Plan: Deborah Humphrey is a 44 y.o. female with PMH anxiety and OCD, who  presents for evaluation of dysphagia.  Patient has presented chronic symptoms of dysphagia which have substantially improved after she started taking PPI, while she has also had resolution of her heartburn.  She has not presented any red flag sign.  Due to this, I consider her symptomatology is consistent with dysphagia induced by possible GERD related esophagitis, for which she should continue taking her omeprazole 40 mg every day.  We will evaluate the control of her symptoms in the future, if she presents any recurrent dysphagia we will schedule her for an EGD with possible dilation.  If her symptoms are well controlled in follow-up appointment, we will decrease the dosage to 20 mg every day.  Patient was given recommendations regarding adequate way to take the medication.  - Continue omeprazole 40 mg qday, will discuss decreasing dose in follow up appointment if asymptomatic - Explained presumed etiology of reflux symptoms. Instruction provided in the use of antireflux medication - patient should take medication in the morning 30-45 minutes before eating breakfast. Discussed avoidance of eating within 2 hours of lying down to sleep and benefit of blocks to elevate head of bed. - RTC 3 months  All questions were answered.  Deborah Peppers, MD Gastroenterology and Hepatology [facility]

## 2020-11-14 NOTE — Patient Instructions (Addendum)
Continue omeprazole 40 mg qday, will discuss decreasing dose in follow up appointment if asymptomatic Please call back if you have any recurrent dysphagia in between your symptoms Explained presumed etiology of reflux symptoms. Instruction provided in the use of antireflux medication - patient should take medication in the morning 30-45 minutes before eating breakfast. Discussed avoidance of eating within 2 hours of lying down to sleep and benefit of blocks to elevate head of bed.

## 2020-12-26 ENCOUNTER — Other Ambulatory Visit (HOSPITAL_COMMUNITY): Payer: Self-pay | Admitting: Physician Assistant

## 2020-12-26 DIAGNOSIS — R1032 Left lower quadrant pain: Secondary | ICD-10-CM

## 2021-01-01 ENCOUNTER — Encounter (HOSPITAL_COMMUNITY): Payer: Self-pay

## 2021-01-01 ENCOUNTER — Other Ambulatory Visit: Payer: Self-pay

## 2021-01-01 ENCOUNTER — Ambulatory Visit (HOSPITAL_COMMUNITY)
Admission: RE | Admit: 2021-01-01 | Discharge: 2021-01-01 | Disposition: A | Discharge status: Home / Self Care | Payer: 59 | Source: Ambulatory Visit | Attending: Physician Assistant | Admitting: Physician Assistant

## 2021-01-01 DIAGNOSIS — R1032 Left lower quadrant pain: Secondary | ICD-10-CM

## 2021-01-01 MED ORDER — IOHEXOL 300 MG/ML  SOLN
100.0000 mL | Freq: Once | INTRAMUSCULAR | Status: AC | PRN
  Administered 2021-01-01: 100 mL via INTRAVENOUS

## 2021-01-25 ENCOUNTER — Encounter: Payer: Self-pay | Admitting: Adult Health

## 2021-01-25 ENCOUNTER — Other Ambulatory Visit (HOSPITAL_COMMUNITY)
Admission: RE | Admit: 2021-01-25 | Discharge: 2021-01-25 | Disposition: A | Discharge status: Home / Self Care | Payer: 59 | Source: Ambulatory Visit | Attending: Adult Health | Admitting: Adult Health

## 2021-01-25 ENCOUNTER — Other Ambulatory Visit: Payer: Self-pay

## 2021-01-25 ENCOUNTER — Ambulatory Visit (INDEPENDENT_AMBULATORY_CARE_PROVIDER_SITE_OTHER): Payer: 59 | Admitting: Adult Health

## 2021-01-25 VITALS — BP 119/77 | HR 71 | Ht 62.5 in | Wt 212.5 lb

## 2021-01-25 DIAGNOSIS — R1032 Left lower quadrant pain: Secondary | ICD-10-CM

## 2021-01-25 DIAGNOSIS — Z1231 Encounter for screening mammogram for malignant neoplasm of breast: Secondary | ICD-10-CM

## 2021-01-25 DIAGNOSIS — Z1211 Encounter for screening for malignant neoplasm of colon: Secondary | ICD-10-CM | POA: Diagnosis not present

## 2021-01-25 DIAGNOSIS — Z01419 Encounter for gynecological examination (general) (routine) without abnormal findings: Secondary | ICD-10-CM

## 2021-01-25 LAB — HEMOCCULT GUIAC POC 1CARD (OFFICE): Fecal Occult Blood, POC: NEGATIVE

## 2021-01-25 MED ORDER — DICYCLOMINE HCL 10 MG PO CAPS: 10.0000 mg | ORAL_CAPSULE | Freq: Three times a day (TID) | ORAL | 1 refills | Status: DC

## 2021-01-25 NOTE — Progress Notes (Signed)
Patient ID: [name], female   DOB: Aug 14, 1976, 45 y.o.   MRN: [id number] History of Present Illness:  Deborah Humphrey is a 45 year old white female,married, G1P1 in for well woman gyn exam and pap. She is having LLQ pain on and off for years usually 3-4 x a year, but has had more recently and was seen at Urgent Care and she had negative CT 01/01/21. She has had decrease appetite and some alternating constipation and diarrhea and is worse after eating and has nausea after eating. The pain sometimes feels like gas cramp. PCP is Dr Deborah Humphrey.  Current Medications, Allergies, Past Medical History, Past Surgical History, Family History and Social History were reviewed in [employer] record.     Review of Systems:  Patient denies any headaches, hearing loss, fatigue, blurred vision, shortness of breath, chest pain,  problems with  urination, or intercourse. No joint pain or mood swings. See HPI for positives.  Physical Exam:BP 119/77 (BP Location: Left Arm, Patient Position: Sitting, Cuff Size: Normal)   Pulse 71   Ht 5' 2.5" (1.588 m)   Wt 212 lb 8 oz (96.4 kg)   BMI 38.25 kg/m  General:  Well developed, well nourished, no acute distress Skin:  Warm and dry Neck:  Midline trachea, normal thyroid, good ROM, no lymphadenopathy Lungs; Clear to auscultation bilaterally Breast:  No dominant palpable mass, retraction, or nipple discharge Cardiovascular: Regular rate and rhythm Abdomen:  Soft, non tender, no hepatosplenomegaly Pelvic:  External genitalia is normal in appearance, no lesions. She has numerous skin tags.The vagina is normal in appearance. Urethra has no lesions or masses. The cervix is bulbous.Pap with HR HPV genotyping performed.  Uterus is felt to be normal size, shape, and contour.  No adnexal masses or tenderness noted.Bladder is non tender, no masses felt. Rectal: Good sphincter tone, no polyps, or hemorrhoids felt,has external remnant.  Hemoccult  negative. Extremities/musculoskeletal:  No swelling or varicosities noted, no clubbing or cyanosis Psych:  No mood changes, alert and cooperative,seems happy AA is 3 Fall risk is low PHQ 9 score is 1 GAD 7 score is 1  Upstream - 01/25/21 1339      Pregnancy Intention Screening   Does the patient want to become pregnant in the next year? No    Does the patient's partner want to become pregnant in the next year? No    Would the patient like to discuss contraceptive options today? No      Contraception Wrap Up   Current Method Female Sterilization   ablation   End Method Female Sterilization   ablation   Contraception Counseling Provided No         Examination chaperoned by Levy Pupa LPN.   Impression and Plan: 1. Encounter for gynecological examination with Papanicolaou smear of cervix Pap sent Physical in 1 year Pap in 3 if normal Labs with PCP  2. Encounter for screening fecal occult blood testing  3. Left lower quadrant abdominal pain Will try bentyl Meds ordered this encounter  Medications  . dicyclomine (BENTYL) 10 MG capsule    Sig: Take 1 capsule (10 mg total) by mouth 3 (three) times daily before meals.    Dispense:  90 capsule    Refill:  1    Order Specific Question:   Supervising Provider    Answer:   Florian Buff [[id number]]  Decrease spicy food,and  alcohol  Has appt with GI  02/19/21.  4. Screening mammogram for breast cancer  Has mammogram 03/2021 at the [facility] in [place]

## 2021-01-29 LAB — CYTOLOGY - PAP
Comment: NEGATIVE
Diagnosis: NEGATIVE
High risk HPV: NEGATIVE

## 2021-02-19 ENCOUNTER — Other Ambulatory Visit (INDEPENDENT_AMBULATORY_CARE_PROVIDER_SITE_OTHER): Payer: Self-pay

## 2021-02-19 ENCOUNTER — Other Ambulatory Visit: Payer: Self-pay

## 2021-02-19 ENCOUNTER — Encounter (INDEPENDENT_AMBULATORY_CARE_PROVIDER_SITE_OTHER): Payer: Self-pay

## 2021-02-19 ENCOUNTER — Ambulatory Visit (INDEPENDENT_AMBULATORY_CARE_PROVIDER_SITE_OTHER): Payer: 59 | Admitting: Gastroenterology

## 2021-02-19 ENCOUNTER — Telehealth (INDEPENDENT_AMBULATORY_CARE_PROVIDER_SITE_OTHER): Payer: Self-pay

## 2021-02-19 ENCOUNTER — Encounter (INDEPENDENT_AMBULATORY_CARE_PROVIDER_SITE_OTHER): Payer: Self-pay | Admitting: Gastroenterology

## 2021-02-19 VITALS — BP 128/86 | HR 76 | Temp 98.2°F | Ht 62.5 in | Wt 213.0 lb

## 2021-02-19 DIAGNOSIS — R1084 Generalized abdominal pain: Secondary | ICD-10-CM | POA: Diagnosis not present

## 2021-02-19 DIAGNOSIS — R198 Other specified symptoms and signs involving the digestive system and abdomen: Secondary | ICD-10-CM

## 2021-02-19 DIAGNOSIS — K219 Gastro-esophageal reflux disease without esophagitis: Secondary | ICD-10-CM

## 2021-02-19 DIAGNOSIS — R109 Unspecified abdominal pain: Secondary | ICD-10-CM | POA: Insufficient documentation

## 2021-02-19 DIAGNOSIS — Z1211 Encounter for screening for malignant neoplasm of colon: Secondary | ICD-10-CM

## 2021-02-19 MED ORDER — PEG 3350-KCL-NA BICARB-NACL 420 G PO SOLR: 4000.0000 mL | ORAL | 0 refills | Status: DC

## 2021-02-19 MED ORDER — HYOSCYAMINE SULFATE 0.125 MG PO TABS: 0.1250 mg | ORAL_TABLET | ORAL | 0 refills | Status: DC | PRN

## 2021-02-19 NOTE — Progress Notes (Signed)
[name], M.D. Gastroenterology & Hepatology [facility] [address] [place], [place] [address]  Primary Care Physician: Manon Hilding, MD [place] [address]  I will communicate my assessment and recommendations to the referring MD via EMR.  Problems: 1. Change in bowel movements and abdominal pain, possibly related to IBS 2. GERD  History of Present Illness: Deborah Humphrey is a 45 y.o. female with PMH GERD, anxiety and OCD, who presents for evaluation of  change in bowel movements, abdominal pain and bloating.  The patient was last seen on 11/14/2020. At that time, the patient was advised to continue taking omeprazole 40 mg every day and she was given lifestyle modifications to improve her symptoms.  States that this strategy has completely controlled the symptoms.  The patient comes today as she has been concerned due to chronic symptoms that she did not discuss in her last clinic appointment.  Patient reports that she has been presenting chronic abdominal pain in the lower area of the abdomen. She reported the pain got worse for the last 2 years.  The pain is usually located in the LLQ abdomen but most recently has been migrating to the upper abdomen and right upper quadrant. Since January 2022 she has presented 3-4 episodes of abdominal pain which have been very severe in intensity. Also reports some episodes of bloating when the pain is present. She also reports fluctuation of her bowel movement frequency, as she has diarrhea in multiple episodes per day which ends by the time her abdominal pain resolves. She also reports that this is followed by 5-7 days with no bowel movements at all. She reports that moving around and eating cause the pain to be worse. She also reports that when she is sleeping the abdominal pain only wakes her up if she lays on her abdomen. She also has presented intermittent episodes of nausea and  vomiting, which she thinks has happened 3 times since January. Thinks she has lost close to 10 lb since her symptoms worsened earlier this year.  She believes stress worsens her symptoms.  The patient is not following any diet at the moment.  The patient denies having any fever, chills, hematochezia, melena, hematemesis,  jaundice, pruritus.  She has been taking hydrocodone as needed for severe bouts of abdominal pain, as she used a previous prescription she had in the past -she was prescribed a few pills but she only takes it only if she has severe pain.  Most recent cross-sectional imaging was performed on 01/01/2021 with a CT of the abdomen and pelvis with IV contrast that was within normal limits.  Last EGD: 2009 - found to have H. Pylori, treated for it. No report is available. Last Colonoscopy:never  Past Medical History: Past Medical History:  Diagnosis Date  . Cervical spine fracture (HCC)    Denies havign surgery   . gestational hypertention    gestational hypertension  . Helicobacter pylori (H. pylori)   . Infertility, female     Past Surgical History: Past Surgical History:  Procedure Laterality Date  . CESAREAN SECTION    . DILATATION AND CURETTAGE/HYSTEROSCOPY WITH MINERVA N/A 08/22/2020   Procedure: DILATATION AND CURETTAGE/HYSTEROSCOPY WITH MINERVA;  Surgeon: Jonnie Kind, MD;  Location: [facility];  Service: Gynecology;  Laterality: N/A;  . DILATION AND CURETTAGE OF UTERUS     x3  . HALO APPLICATION     age 48  . LAPAROSCOPIC BILATERAL SALPINGECTOMY  Bilateral 08/22/2020   Procedure: LAPAROSCOPIC BILATERAL SALPINGECTOMY;  Surgeon: Jonnie Kind, MD;  Location: [facility];  Service: Gynecology;  Laterality: Bilateral;    Family History: Family History  Problem Relation Age of Onset  . Cancer Paternal Grandfather        pancreatic?  Marland Kitchen Cancer Paternal Grandmother        liver  . Dementia Maternal Grandmother   . Cancer Father        pancreatic  . Cancer  Mother        skin  . Breast cancer Sister   . Polycystic ovary syndrome Sister   . Endometriosis Sister   . Breast cancer Sister   . Breast cancer Paternal Aunt     Social History: Social History   Tobacco Use  Smoking Status Former Smoker  . Packs/day: 1.00  . Years: 15.00  . Pack years: 15.00  . Types: Cigarettes  . Quit date: 01/31/2011  . Years since quitting: 10.0  Smokeless Tobacco Never Used   Social History   Substance and Sexual Activity  Alcohol Use Yes   Comment: occassional   Social History   Substance and Sexual Activity  Drug Use No    Allergies: Allergies  Allergen Reactions  . Bee Venom Swelling    Medications: Current Outpatient Medications  Medication Sig Dispense Refill  . acetaminophen (TYLENOL) 500 MG tablet Take 1,000 mg by mouth every 6 (six) hours as needed for moderate pain.    Marland Kitchen dicyclomine (BENTYL) 10 MG capsule Take 1 capsule (10 mg total) by mouth 3 (three) times daily before meals. 90 capsule 1  . fluvoxaMINE (LUVOX) 50 MG tablet Take 50 mg by mouth at bedtime.    Marland Kitchen HYDROcodone-acetaminophen (NORCO/VICODIN) 5-325 MG tablet Take 1 tablet by mouth every 8 (eight) hours as needed.    Marland Kitchen omeprazole (PRILOSEC) 40 MG capsule Take 1 capsule (40 mg total) by mouth daily. 90 capsule 0   No current facility-administered medications for this visit.    Review of Systems: GENERAL: negative for malaise, night sweats HEENT: No changes in hearing or vision, no nose bleeds or other nasal problems. NECK: Negative for lumps, goiter, pain and significant neck swelling RESPIRATORY: Negative for cough, wheezing CARDIOVASCULAR: Negative for chest pain, leg swelling, palpitations, orthopnea GI: SEE HPI MUSCULOSKELETAL: Negative for joint pain or swelling, back pain, and muscle pain. SKIN: Negative for lesions, rash PSYCH: Negative for sleep disturbance, mood disorder and recent psychosocial stressors. HEMATOLOGY Negative for prolonged bleeding,  bruising easily, and swollen nodes. ENDOCRINE: Negative for cold or heat intolerance, polyuria, polydipsia and goiter. NEURO: negative for tremor, gait imbalance, syncope and seizures. The remainder of the review of systems is noncontributory.   Physical Exam: BP 128/86 (BP Location: Left Arm, Patient Position: Sitting, Cuff Size: Large)   Pulse 76   Temp 98.2 F (36.8 C) (Oral)   Ht 5' 2.5" (1.588 m)   Wt 213 lb (96.6 kg)   BMI 38.34 kg/m  GENERAL: The patient is AO x3, in no acute distress. HEENT: Head is normocephalic and atraumatic. EOMI are intact. Mouth is well hydrated and without lesions. NECK: Supple. No masses LUNGS: Clear to auscultation. No presence of rhonchi/wheezing/rales. Adequate chest expansion HEART: RRR, normal s1 and s2. ABDOMEN: tender to palpation diffuesly but worse in RLQ, no guarding, no peritoneal signs, and nondistended. BS +. No masses. EXTREMITIES: Without any cyanosis, clubbing, rash, lesions or edema. NEUROLOGIC: AOx3, no focal motor deficit. SKIN: no jaundice, no rashes  Imaging/Labs:  as above  I personally reviewed and interpreted the available labs, imaging and endoscopic files.  Impression and Plan: Deborah Humphrey is a 45 y.o. female with PMH GERD, anxiety and OCD, who presents for evaluation of  change in bowel movements, abdominal pain and bloating.  The patient has presented a constellation of symptoms which given her age and lack of red flag signs with normal abdominal imaging recently, are mostly consistent with a functional etiology such as irritable bowel syndrome mixed type.  However, given the worsening symptoms she has presented, I consider it appropriate to perform endoscopic investigations with EGD with small bowel biopsies and colonoscopy with random colonic biopsies to evaluate this further, ruling out organic etiologies.  The patient understood and agreed.  We also obtain CBC, CMP, TSH and celiac serologies as part of her evaluation.   She can take Imodium as needed but I will send a prescription for Levsin (will need to stop dicyclomine for now) and she can take MiraLAX as needed if she becomes constipated again.  Finally, as part of the treatment for for bloating, she may implement a low FODMAP diet.  - Schedule EGD and colonoscopy - Check CBC, CMP, TSH and celiac serologies - Start Imodium as needed for diarrhea - Can take Miralax or stool softener if constipated - Stop Bentyl, take Levsin as needed for abdominal pain episodes - Patient was counseled about the benefit of implementing a low FODMAP to improve symptoms and recurrent episodes. A dietary list was provided to the patient. Also, the patient was counseled about the benefit of avoiding stressing situations and potential environmental triggers leading to symptomatology. - RTC 3 months  All questions were answered.      Harvel Quale, MD Gastroenterology and Hepatology [facility] [place] [facility]

## 2021-02-19 NOTE — Patient Instructions (Addendum)
Schedule EGD and colonoscopy Perform blood workup Start Imodium as needed for diarrhea, max 4 tabs per day Can take Miralax or stool softener if constipated Stop Bentyl, take Levsin as needed for abdominal pain episodes  Patient was counseled about the benefit of implementing a low FODMAP to improve symptoms and recurrent episodes. A dietary list was provided to the patient. Also, the patient was counseled about the benefit of avoiding stressing situations and potential environmental triggers leading to symptomatology.

## 2021-02-19 NOTE — Telephone Encounter (Signed)
LeighAnn Lovelace, CMA  

## 2021-02-19 NOTE — H&P (View-Only) (Signed)
[name], M.D. Gastroenterology & Hepatology [facility] [address] [place], [place] [id number]  Primary Care Physician: Manon Hilding, MD [place] [address]  I will communicate my assessment and recommendations to the referring MD via EMR.  Problems: 1. Change in bowel movements and abdominal pain, possibly related to IBS 2. GERD  History of Present Illness: Deborah Humphrey is a 45 y.o. female with PMH GERD, anxiety and OCD, who presents for evaluation of  change in bowel movements, abdominal pain and bloating.  The patient was last seen on 11/14/2020. At that time, the patient was advised to continue taking omeprazole 40 mg every day and she was given lifestyle modifications to improve her symptoms.  States that this strategy has completely controlled the symptoms.  The patient comes today as she has been concerned due to chronic symptoms that she did not discuss in her last clinic appointment.  Patient reports that she has been presenting chronic abdominal pain in the lower area of the abdomen. She reported the pain got worse for the last 2 years.  The pain is usually located in the LLQ abdomen but most recently has been migrating to the upper abdomen and right upper quadrant. Since January 2022 she has presented 3-4 episodes of abdominal pain which have been very severe in intensity. Also reports some episodes of bloating when the pain is present. She also reports fluctuation of her bowel movement frequency, as she has diarrhea in multiple episodes per day which ends by the time her abdominal pain resolves. She also reports that this is followed by 5-7 days with no bowel movements at all. She reports that moving around and eating cause the pain to be worse. She also reports that when she is sleeping the abdominal pain only wakes her up if she lays on her abdomen. She also has presented intermittent episodes of nausea and  vomiting, which she thinks has happened 3 times since January. Thinks she has lost close to 10 lb since her symptoms worsened earlier this year.  She believes stress worsens her symptoms.  The patient is not following any diet at the moment.  The patient denies having any fever, chills, hematochezia, melena, hematemesis,  jaundice, pruritus.  She has been taking hydrocodone as needed for severe bouts of abdominal pain, as she used a previous prescription she had in the past -she was prescribed a few pills but she only takes it only if she has severe pain.  Most recent cross-sectional imaging was performed on 01/01/2021 with a CT of the abdomen and pelvis with IV contrast that was within normal limits.  Last EGD: 2009 - found to have H. Pylori, treated for it. No report is available. Last Colonoscopy:never  Past Medical History: Past Medical History:  Diagnosis Date  . Cervical spine fracture (HCC)    Denies havign surgery   . gestational hypertention    gestational hypertension  . Helicobacter pylori (H. pylori)   . Infertility, female     Past Surgical History: Past Surgical History:  Procedure Laterality Date  . CESAREAN SECTION    . DILATATION AND CURETTAGE/HYSTEROSCOPY WITH MINERVA N/A 08/22/2020   Procedure: DILATATION AND CURETTAGE/HYSTEROSCOPY WITH MINERVA;  Surgeon: Jonnie Kind, MD;  Location: [facility];  Service: Gynecology;  Laterality: N/A;  . DILATION AND CURETTAGE OF UTERUS     x3  . HALO APPLICATION     age 60  . LAPAROSCOPIC BILATERAL SALPINGECTOMY  Bilateral 08/22/2020   Procedure: LAPAROSCOPIC BILATERAL SALPINGECTOMY;  Surgeon: Jonnie Kind, MD;  Location: [facility];  Service: Gynecology;  Laterality: Bilateral;    Family History: Family History  Problem Relation Age of Onset  . Cancer Paternal Grandfather        pancreatic?  Marland Kitchen Cancer Paternal Grandmother        liver  . Dementia Maternal Grandmother   . Cancer Father        pancreatic  . Cancer  Mother        skin  . Breast cancer Sister   . Polycystic ovary syndrome Sister   . Endometriosis Sister   . Breast cancer Sister   . Breast cancer Paternal Aunt     Social History: Social History   Tobacco Use  Smoking Status Former Smoker  . Packs/day: 1.00  . Years: 15.00  . Pack years: 15.00  . Types: Cigarettes  . Quit date: 01/31/2011  . Years since quitting: 10.0  Smokeless Tobacco Never Used   Social History   Substance and Sexual Activity  Alcohol Use Yes   Comment: occassional   Social History   Substance and Sexual Activity  Drug Use No    Allergies: Allergies  Allergen Reactions  . Bee Venom Swelling    Medications: Current Outpatient Medications  Medication Sig Dispense Refill  . acetaminophen (TYLENOL) 500 MG tablet Take 1,000 mg by mouth every 6 (six) hours as needed for moderate pain.    Marland Kitchen dicyclomine (BENTYL) 10 MG capsule Take 1 capsule (10 mg total) by mouth 3 (three) times daily before meals. 90 capsule 1  . fluvoxaMINE (LUVOX) 50 MG tablet Take 50 mg by mouth at bedtime.    Marland Kitchen HYDROcodone-acetaminophen (NORCO/VICODIN) 5-325 MG tablet Take 1 tablet by mouth every 8 (eight) hours as needed.    Marland Kitchen omeprazole (PRILOSEC) 40 MG capsule Take 1 capsule (40 mg total) by mouth daily. 90 capsule 0   No current facility-administered medications for this visit.    Review of Systems: GENERAL: negative for malaise, night sweats HEENT: No changes in hearing or vision, no nose bleeds or other nasal problems. NECK: Negative for lumps, goiter, pain and significant neck swelling RESPIRATORY: Negative for cough, wheezing CARDIOVASCULAR: Negative for chest pain, leg swelling, palpitations, orthopnea GI: SEE HPI MUSCULOSKELETAL: Negative for joint pain or swelling, back pain, and muscle pain. SKIN: Negative for lesions, rash PSYCH: Negative for sleep disturbance, mood disorder and recent psychosocial stressors. HEMATOLOGY Negative for prolonged bleeding,  bruising easily, and swollen nodes. ENDOCRINE: Negative for cold or heat intolerance, polyuria, polydipsia and goiter. NEURO: negative for tremor, gait imbalance, syncope and seizures. The remainder of the review of systems is noncontributory.   Physical Exam: BP 128/86 (BP Location: Left Arm, Patient Position: Sitting, Cuff Size: Large)   Pulse 76   Temp 98.2 F (36.8 C) (Oral)   Ht 5' 2.5" (1.588 m)   Wt 213 lb (96.6 kg)   BMI 38.34 kg/m  GENERAL: The patient is AO x3, in no acute distress. HEENT: Head is normocephalic and atraumatic. EOMI are intact. Mouth is well hydrated and without lesions. NECK: Supple. No masses LUNGS: Clear to auscultation. No presence of rhonchi/wheezing/rales. Adequate chest expansion HEART: RRR, normal s1 and s2. ABDOMEN: tender to palpation diffuesly but worse in RLQ, no guarding, no peritoneal signs, and nondistended. BS +. No masses. EXTREMITIES: Without any cyanosis, clubbing, rash, lesions or edema. NEUROLOGIC: AOx3, no focal motor deficit. SKIN: no jaundice, no rashes  Imaging/Labs:  as above  I personally reviewed and interpreted the available labs, imaging and endoscopic files.  Impression and Plan: Deborah Humphrey is a 45 y.o. female with PMH GERD, anxiety and OCD, who presents for evaluation of  change in bowel movements, abdominal pain and bloating.  The patient has presented a constellation of symptoms which given her age and lack of red flag signs with normal abdominal imaging recently, are mostly consistent with a functional etiology such as irritable bowel syndrome mixed type.  However, given the worsening symptoms she has presented, I consider it appropriate to perform endoscopic investigations with EGD with small bowel biopsies and colonoscopy with random colonic biopsies to evaluate this further, ruling out organic etiologies.  The patient understood and agreed.  We also obtain CBC, CMP, TSH and celiac serologies as part of her evaluation.   She can take Imodium as needed but I will send a prescription for Levsin (will need to stop dicyclomine for now) and she can take MiraLAX as needed if she becomes constipated again.  Finally, as part of the treatment for for bloating, she may implement a low FODMAP diet.  - Schedule EGD and colonoscopy - Check CBC, CMP, TSH and celiac serologies - Start Imodium as needed for diarrhea - Can take Miralax or stool softener if constipated - Stop Bentyl, take Levsin as needed for abdominal pain episodes - Patient was counseled about the benefit of implementing a low FODMAP to improve symptoms and recurrent episodes. A dietary list was provided to the patient. Also, the patient was counseled about the benefit of avoiding stressing situations and potential environmental triggers leading to symptomatology. - RTC 3 months  All questions were answered.      Harvel Quale, MD Gastroenterology and Hepatology [facility]

## 2021-02-20 LAB — CBC WITH DIFFERENTIAL/PLATELET
Absolute Monocytes: 347 cells/uL (ref 200–950)
Basophils Absolute: 59 cells/uL (ref 0–200)
Basophils Relative: 1.3 %
Eosinophils Absolute: 117 cells/uL (ref 15–500)
Eosinophils Relative: 2.6 %
HCT: 42.7 % (ref 35.0–45.0)
Hemoglobin: 14.3 g/dL (ref 11.7–15.5)
Lymphs Abs: 1152 cells/uL (ref 850–3900)
MCH: 28.8 pg (ref 27.0–33.0)
MCHC: 33.5 g/dL (ref 32.0–36.0)
MCV: 86.1 fL (ref 80.0–100.0)
MPV: 10.9 fL (ref 7.5–12.5)
Monocytes Relative: 7.7 %
Neutro Abs: 2826 cells/uL (ref 1500–7800)
Neutrophils Relative %: 62.8 %
Platelets: 202 10*3/uL (ref 140–400)
RBC: 4.96 10*6/uL (ref 3.80–5.10)
RDW: 13.3 % (ref 11.0–15.0)
Total Lymphocyte: 25.6 %
WBC: 4.5 10*3/uL (ref 3.8–10.8)

## 2021-02-20 LAB — COMPREHENSIVE METABOLIC PANEL
AG Ratio: 1.3 (calc) (ref 1.0–2.5)
ALT: 18 U/L (ref 6–29)
AST: 20 U/L (ref 10–30)
Albumin: 4.3 g/dL (ref 3.6–5.1)
Alkaline phosphatase (APISO): 107 U/L (ref 31–125)
BUN: 11 mg/dL (ref 7–25)
CO2: 23 mmol/L (ref 20–32)
Calcium: 9.8 mg/dL (ref 8.6–10.2)
Chloride: 105 mmol/L (ref 98–110)
Creat: 0.72 mg/dL (ref 0.50–1.10)
Globulin: 3.2 g/dL (calc) (ref 1.9–3.7)
Glucose, Bld: 94 mg/dL (ref 65–99)
Potassium: 4 mmol/L (ref 3.5–5.3)
Sodium: 139 mmol/L (ref 135–146)
Total Bilirubin: 0.3 mg/dL (ref 0.2–1.2)
Total Protein: 7.5 g/dL (ref 6.1–8.1)

## 2021-02-20 LAB — IGA: Immunoglobulin A: 245 mg/dL (ref 47–310)

## 2021-02-20 LAB — TISSUE TRANSGLUTAMINASE, IGA: (tTG) Ab, IgA: <1 U/mL

## 2021-02-20 LAB — TSH: TSH: 1.8 mIU/L

## 2021-02-21 ENCOUNTER — Other Ambulatory Visit: Payer: Self-pay

## 2021-02-21 ENCOUNTER — Ambulatory Visit (HOSPITAL_COMMUNITY)
Admission: RE | Admit: 2021-02-21 | Discharge: 2021-02-21 | Disposition: A | Discharge status: Home / Self Care | Payer: 59 | Source: Ambulatory Visit | Attending: Adult Health | Admitting: Adult Health

## 2021-02-21 DIAGNOSIS — Z1231 Encounter for screening mammogram for malignant neoplasm of breast: Secondary | ICD-10-CM | POA: Diagnosis not present

## 2021-02-26 ENCOUNTER — Other Ambulatory Visit (HOSPITAL_COMMUNITY)
Admission: RE | Admit: 2021-02-26 | Discharge: 2021-02-26 | Disposition: A | Discharge status: Home / Self Care | Payer: 59 | Source: Ambulatory Visit | Attending: Gastroenterology | Admitting: Gastroenterology

## 2021-02-26 ENCOUNTER — Other Ambulatory Visit: Payer: Self-pay

## 2021-02-26 DIAGNOSIS — Z01812 Encounter for preprocedural laboratory examination: Secondary | ICD-10-CM | POA: Diagnosis present

## 2021-02-26 DIAGNOSIS — Z20822 Contact with and (suspected) exposure to covid-19: Secondary | ICD-10-CM | POA: Diagnosis not present

## 2021-02-26 NOTE — Progress Notes (Signed)
Tried to call patient and leave a message. The recording came on and said "I'm sorry, your call cannot be completed at this time. Please hang up and call again later. Patient came in, I swabbed her for Covid. Wrong patient.

## 2021-02-27 LAB — SARS CORONAVIRUS 2 (TAT 6-24 HRS): SARS Coronavirus 2: NEGATIVE

## 2021-02-28 ENCOUNTER — Encounter (HOSPITAL_COMMUNITY): Payer: Self-pay | Admitting: Gastroenterology

## 2021-02-28 ENCOUNTER — Ambulatory Visit (HOSPITAL_COMMUNITY): Payer: 59 | Admitting: Anesthesiology

## 2021-02-28 ENCOUNTER — Encounter (HOSPITAL_COMMUNITY)
Admission: RE | Disposition: A | Discharge status: Home / Self Care | Payer: Self-pay | Source: Home / Self Care | Attending: Gastroenterology

## 2021-02-28 ENCOUNTER — Ambulatory Visit (HOSPITAL_COMMUNITY)
Admission: RE | Admit: 2021-02-28 | Discharge: 2021-02-28 | Disposition: A | Discharge status: Home / Self Care | Payer: 59 | Attending: Gastroenterology | Admitting: Gastroenterology

## 2021-02-28 ENCOUNTER — Other Ambulatory Visit: Payer: Self-pay

## 2021-02-28 DIAGNOSIS — Z808 Family history of malignant neoplasm of other organs or systems: Secondary | ICD-10-CM | POA: Insufficient documentation

## 2021-02-28 DIAGNOSIS — Z90722 Acquired absence of ovaries, bilateral: Secondary | ICD-10-CM | POA: Diagnosis not present

## 2021-02-28 DIAGNOSIS — K644 Residual hemorrhoidal skin tags: Secondary | ICD-10-CM | POA: Insufficient documentation

## 2021-02-28 DIAGNOSIS — Z803 Family history of malignant neoplasm of breast: Secondary | ICD-10-CM | POA: Diagnosis not present

## 2021-02-28 DIAGNOSIS — Z87891 Personal history of nicotine dependence: Secondary | ICD-10-CM | POA: Diagnosis not present

## 2021-02-28 DIAGNOSIS — K219 Gastro-esophageal reflux disease without esophagitis: Secondary | ICD-10-CM | POA: Diagnosis not present

## 2021-02-28 DIAGNOSIS — R197 Diarrhea, unspecified: Secondary | ICD-10-CM | POA: Diagnosis not present

## 2021-02-28 DIAGNOSIS — R109 Unspecified abdominal pain: Secondary | ICD-10-CM | POA: Insufficient documentation

## 2021-02-28 DIAGNOSIS — K2270 Barrett's esophagus without dysplasia: Secondary | ICD-10-CM | POA: Diagnosis not present

## 2021-02-28 DIAGNOSIS — K529 Noninfective gastroenteritis and colitis, unspecified: Secondary | ICD-10-CM | POA: Insufficient documentation

## 2021-02-28 DIAGNOSIS — Z800 Family history of malignant neoplasm of digestive organs: Secondary | ICD-10-CM | POA: Diagnosis not present

## 2021-02-28 DIAGNOSIS — Z79899 Other long term (current) drug therapy: Secondary | ICD-10-CM | POA: Insufficient documentation

## 2021-02-28 DIAGNOSIS — Z8759 Personal history of other complications of pregnancy, childbirth and the puerperium: Secondary | ICD-10-CM | POA: Diagnosis not present

## 2021-02-28 DIAGNOSIS — G8929 Other chronic pain: Secondary | ICD-10-CM | POA: Insufficient documentation

## 2021-02-28 DIAGNOSIS — F429 Obsessive-compulsive disorder, unspecified: Secondary | ICD-10-CM | POA: Insufficient documentation

## 2021-02-28 HISTORY — PX: COLONOSCOPY WITH PROPOFOL: SHX5780

## 2021-02-28 HISTORY — PX: BIOPSY: SHX5522

## 2021-02-28 HISTORY — PX: ESOPHAGOGASTRODUODENOSCOPY (EGD) WITH PROPOFOL: SHX5813

## 2021-02-28 SURGERY — COLONOSCOPY WITH PROPOFOL
Anesthesia: General

## 2021-02-28 MED ORDER — LIDOCAINE HCL (CARDIAC) PF 100 MG/5ML IV SOSY
PREFILLED_SYRINGE | INTRAVENOUS | Status: DC | PRN
  Administered 2021-02-28: 50 mg via INTRATRACHEAL

## 2021-02-28 MED ORDER — PROPOFOL 1000 MG/100ML IV EMUL
INTRAVENOUS | Status: AC
  Filled 2021-02-28: qty 100

## 2021-02-28 MED ORDER — KETAMINE HCL 10 MG/ML IJ SOLN
INTRAMUSCULAR | Status: DC | PRN
  Administered 2021-02-28: 15 mg via INTRAVENOUS
  Administered 2021-02-28: 5 mg via INTRAVENOUS

## 2021-02-28 MED ORDER — SPOT INK MARKER SYRINGE KIT
PACK | SUBMUCOSAL | Status: AC
  Filled 2021-02-28: qty 5

## 2021-02-28 MED ORDER — PROPOFOL 10 MG/ML IV BOLUS
INTRAVENOUS | Status: AC
  Filled 2021-02-28: qty 80

## 2021-02-28 MED ORDER — PROPOFOL 10 MG/ML IV BOLUS
INTRAVENOUS | Status: AC
  Filled 2021-02-28: qty 120

## 2021-02-28 MED ORDER — GLYCOPYRROLATE 0.2 MG/ML IJ SOLN
INTRAMUSCULAR | Status: DC | PRN
  Administered 2021-02-28: .1 mg via INTRAVENOUS

## 2021-02-28 MED ORDER — KETAMINE HCL 50 MG/5ML IJ SOSY
PREFILLED_SYRINGE | INTRAMUSCULAR | Status: AC
  Filled 2021-02-28: qty 5

## 2021-02-28 MED ORDER — LACTATED RINGERS IV SOLN: INTRAVENOUS | Status: DC

## 2021-02-28 MED ORDER — PROPOFOL 10 MG/ML IV BOLUS
INTRAVENOUS | Status: DC | PRN
  Administered 2021-02-28: 30 mg via INTRAVENOUS

## 2021-02-28 MED ORDER — STERILE WATER FOR IRRIGATION IR SOLN
Status: DC | PRN
  Administered 2021-02-28: 300 mL

## 2021-02-28 MED ORDER — PROPOFOL 10 MG/ML IV BOLUS
INTRAVENOUS | Status: AC
  Filled 2021-02-28: qty 20

## 2021-02-28 MED ORDER — PROPOFOL 500 MG/50ML IV EMUL
INTRAVENOUS | Status: DC | PRN
  Administered 2021-02-28: 150 ug/kg/min via INTRAVENOUS

## 2021-02-28 NOTE — Anesthesia Postprocedure Evaluation (Signed)
Anesthesia Post Note  Patient: Deborah Humphrey  Procedure(s) Performed: COLONOSCOPY WITH PROPOFOL (N/A ) ESOPHAGOGASTRODUODENOSCOPY (EGD) WITH PROPOFOL (N/A ) BIOPSY  Patient location during evaluation: Phase II Anesthesia Type: General Level of consciousness: awake and alert and oriented Pain management: pain level controlled Vital Signs Assessment: post-procedure vital signs reviewed and stable Respiratory status: spontaneous breathing, nonlabored ventilation and respiratory function stable Cardiovascular status: stable Postop Assessment: no apparent nausea or vomiting Anesthetic complications: no   No complications documented.   Last Vitals:  Vitals:   02/28/21 0833  BP: 127/77  Pulse: 88  Resp: 15  Temp: 36.7 C  SpO2: 97%    Last Pain:  Vitals:   02/28/21 1039  TempSrc:   PainSc: 0-No pain                 Tyreik Delahoussaye Hristova

## 2021-02-28 NOTE — Anesthesia Preprocedure Evaluation (Addendum)
Anesthesia Evaluation  Patient identified by MRN, date of birth, ID band Patient awake    Reviewed: Allergy & Precautions, NPO status , Patient's Chart, lab work & pertinent test results  History of Anesthesia Complications Negative for: history of anesthetic complications  Airway Mallampati: II  TM Distance: >3 FB Neck ROM: Full   Comment: cervical spine fx Dental  (+) Dental Advisory Given, Upper Dentures   Pulmonary former smoker,    Pulmonary exam normal breath sounds clear to auscultation       Cardiovascular Exercise Tolerance: Good hypertension (gestational, not on meds), Normal cardiovascular exam Rhythm:Regular Rate:Normal     Neuro/Psych PSYCHIATRIC DISORDERS negative neurological ROS     GI/Hepatic Neg liver ROS, GERD  Medicated and Controlled,  Endo/Other  negative endocrine ROS  Renal/GU negative Renal ROS  negative genitourinary   Musculoskeletal  (+) Arthritis  (cervical spine fx),   Abdominal   Peds negative pediatric ROS (+)  Hematology negative hematology ROS (+)   Anesthesia Other Findings   Reproductive/Obstetrics negative OB ROS                            Anesthesia Physical Anesthesia Plan  ASA: II  Anesthesia Plan: General   Post-op Pain Management:    Induction: Intravenous  PONV Risk Score and Plan: Propofol infusion  Airway Management Planned: Nasal Cannula and Natural Airway  Additional Equipment:   Intra-op Plan:   Post-operative Plan:   Informed Consent: I have reviewed the patients History and Physical, chart, labs and discussed the procedure including the risks, benefits and alternatives for the proposed anesthesia with the patient or authorized representative who has indicated his/her understanding and acceptance.     Dental advisory given  Plan Discussed with: CRNA and Surgeon  Anesthesia Plan Comments:         Anesthesia Quick  Evaluation

## 2021-02-28 NOTE — Interval H&P Note (Signed)
History and Physical Interval Note:  02/28/2021 9:39 AM Deborah Humphrey is a 45 y.o. female with PMH GERD, anxiety and OCD, who presents for evaluation of  intermittent diarrhea, tenesmus, abdominal pain and bloating.  Patient reports that since last time seen in clinic her abdominal pain has resolved and has not needed Levsin. Still having 23 watery Bms per day with some hesitancy without melena or hematochezia. Has presented intermittent bloating. Has not started taking the Miralax yet.  BP 127/77   Pulse 88   Temp 98.1 F (36.7 C) (Oral)   Resp 15   Ht 5' 2.5" (1.588 m)   Wt 96.6 kg   SpO2 97%   BMI 38.34 kg/m  GENERAL: The patient is AO x3, in no acute distress. HEENT: Head is normocephalic and atraumatic. EOMI are intact. Mouth is well hydrated and without lesions. NECK: Supple. No masses LUNGS: Clear to auscultation. No presence of rhonchi/wheezing/rales. Adequate chest expansion HEART: RRR, normal s1 and s2. ABDOMEN: Soft, nontender, no guarding, no peritoneal signs, and nondistended. BS +. No masses. EXTREMITIES: Without any cyanosis, clubbing, rash, lesions or edema. NEUROLOGIC: AOx3, no focal motor deficit. SKIN: no jaundice, no rashes  [name]  has presented today for surgery, with the diagnosis of Chronic Diarrhea Abdominal Pain.  The various methods of treatment have been discussed with the patient and family. After consideration of risks, benefits and other options for treatment, the patient has consented to  Procedure(s) with comments: COLONOSCOPY WITH PROPOFOL (N/A) - AM ESOPHAGOGASTRODUODENOSCOPY (EGD) WITH PROPOFOL (N/A) as a surgical intervention.  The patient's history has been reviewed, patient examined, no change in status, stable for surgery.  I have reviewed the patient's chart and labs.  Questions were answered to the patient's satisfaction.     [name]

## 2021-02-28 NOTE — Transfer of Care (Signed)
Immediate Anesthesia Transfer of Care Note  Patient: Deborah Humphrey  Procedure(s) Performed: COLONOSCOPY WITH PROPOFOL (N/A ) ESOPHAGOGASTRODUODENOSCOPY (EGD) WITH PROPOFOL (N/A ) BIOPSY  Patient Location: PACU  Anesthesia Type:General  Level of Consciousness: awake  Airway & Oxygen Therapy: Patient Spontanous Breathing  Post-op Assessment: Report given to RN and Post -op Vital signs reviewed and stable  Post vital signs: Reviewed and stable  Last Vitals:  Vitals Value Taken Time  BP    Temp    Pulse    Resp    SpO2      Last Pain:  Vitals:   02/28/21 1039  TempSrc:   PainSc: 0-No pain      Patients Stated Pain Goal: 6 (24/17/53 0104)  Complications: No complications documented.

## 2021-02-28 NOTE — Op Note (Signed)
[facility] Patient Name: Deborah Humphrey Procedure Date: 02/28/2021 11:03 AM MRN: [id number] Date of Birth: 1975/12/15 Attending MD: [name] ,  CSN: [id number] Age: 45 Admit Type: Outpatient Procedure:                Colonoscopy Indications:              Chronic diarrhea Providers:                [name], Crystal Page, Raphael Gibney,                            Technician Referring MD:              Medicines:                Monitored Anesthesia Care Complications:            No immediate complications. Estimated Blood Loss:     Estimated blood loss: none. Procedure:                Pre-Anesthesia Assessment:                           - Prior to the procedure, a History and Physical                            was performed, and patient medications, allergies                            and sensitivities were reviewed. The patient's                            tolerance of previous anesthesia was reviewed.                           - The risks and benefits of the procedure and the                            sedation options and risks were discussed with the                            patient. All questions were answered and informed                            consent was obtained.                           - ASA Grade Assessment: II - A patient with mild                            systemic disease.                           After obtaining informed consent, the colonoscope                            was passed under direct vision. Throughout the  procedure, the patient's blood pressure, pulse, and                            oxygen saturations were monitored continuously. The                            [id number]-[id number] ([id number]) scope was introduced through                            the anus and advanced to the the cecum, identified                            by appendiceal orifice and ileocecal valve. The                            colonoscopy was  performed without difficulty. The                            patient tolerated the procedure well. The quality                            of the bowel preparation was excellent. Scope                            withdrawal time was 10 minutes. Scope In: 11:06:36 AM Scope Out: 11:23:24 AM Scope Withdrawal Time: 0 hours 13 minutes 3 seconds  Total Procedure Duration: 0 hours 16 minutes 48 seconds  Findings:      The colon (entire examined portion) appeared normal. Biopsies for       histology were taken with a cold forceps from the right colon and left       colon for evaluation of microscopic colitis.      The retroflexed view of the distal rectum and anal verge was normal and       showed no anal or rectal abnormalities.      Skin tags were found on perianal exam. Impression:               - The entire examined colon is normal. Biopsied.                           - The distal rectum and anal verge are normal on                            retroflexion view.                           - Perianal skin tags found on perianal exam. Moderate Sedation:      Per Anesthesia Care Recommendation:           - Discharge patient to home (ambulatory).                           - Resume previous diet.                           - Await  pathology results.                           - Repeat colonoscopy in 10 years for screening                            purposes. Procedure Code(s):        --- Professional ---                           [phone], Colonoscopy, flexible; with biopsy, single                            or multiple Diagnosis Code(s):        --- Professional ---                           K64.4, Residual hemorrhoidal skin tags                           K52.9, Noninfective gastroenteritis and colitis,                            unspecified CPT copyright 2019 American Medical Association. All rights reserved. The codes documented in this report are preliminary and upon coder review may  be revised  to meet current compliance requirements. [name], MD [name],  02/28/2021 11:27:56 AM This report has been signed electronically. Number of Addenda: 0

## 2021-02-28 NOTE — Op Note (Signed)
[facility] Patient Name: Deborah Humphrey Procedure Date: 02/28/2021 10:41 AM MRN: [id number] Date of Birth: 11/06/1976 Attending MD: [name] ,  CSN: [id number] Age: 45 Admit Type: Outpatient Procedure:                Upper GI endoscopy Indications:              Abdominal pain, Diarrhea Providers:                [name], Crystal Page, Raphael Gibney,                            Technician Referring MD:              Medicines:                Monitored Anesthesia Care Complications:            No immediate complications. Estimated Blood Loss:     Estimated blood loss: none. Procedure:                Pre-Anesthesia Assessment:                           - Prior to the procedure, a History and Physical                            was performed, and patient medications, allergies                            and sensitivities were reviewed. The patient's                            tolerance of previous anesthesia was reviewed.                           - The risks and benefits of the procedure and the                            sedation options and risks were discussed with the                            patient. All questions were answered and informed                            consent was obtained.                           - ASA Grade Assessment: II - A patient with mild                            systemic disease.                           After obtaining informed consent, the endoscope was                            passed under direct vision. Throughout the  procedure, the patient's blood pressure, pulse, and                            oxygen saturations were monitored continuously. The                            [id number]-[id number] ([id number]) scope was introduced through the                            mouth, and advanced to the second part of duodenum.                            The upper GI endoscopy was accomplished without                             difficulty. The patient tolerated the procedure                            well. Scope In: 10:48:11 AM Scope Out: 10:59:29 AM Total Procedure Duration: 0 hours 11 minutes 18 seconds  Findings:      The esophagus and gastroesophageal junction were examined with white       light and narrow band imaging (NBI). There were esophageal mucosal       changes classified as Barrett's stage C0-M2 per Prague criteria. These       changes involved the mucosa at the upper extent of the gastric folds (38       cm from the incisors) extending to the Z-line (36 cm from the incisors).       Three tongues of salmon-colored mucosa were present from 36 to 38 cm.       The maximum longitudinal extent of these esophageal mucosal changes was       2 cm in length. This was biopsied with a cold forceps for histology.      The entire examined stomach was normal.      The examined duodenum was normal. Biopsies were taken with a cold       forceps for histology. Impression:               - Esophageal mucosal changes classified as                            Barrett's stage C0-M2 per Prague criteria. Biopsied.                           - Normal stomach.                           - Normal examined duodenum. Biopsied. Moderate Sedation:      Per Anesthesia Care Recommendation:           - Discharge patient to home (ambulatory).                           - Resume previous diet.                           - Await pathology results.                           -  Continue present medications, including Prilosec. Procedure Code(s):        --- Professional ---                           [phone], Esophagogastroduodenoscopy, flexible,                            transoral; with biopsy, single or multiple Diagnosis Code(s):        --- Professional ---                           K22.70, Barrett's esophagus without dysplasia                           R10.9, Unspecified abdominal pain                           R19.7, Diarrhea,  unspecified CPT copyright 2019 American Medical Association. All rights reserved. The codes documented in this report are preliminary and upon coder review may  be revised to meet current compliance requirements. [name], MD [name],  02/28/2021 11:04:02 AM This report has been signed electronically. Number of Addenda: 0

## 2021-02-28 NOTE — Discharge Instructions (Signed)
You are being discharged to home.  Resume your previous diet.  We are waiting for your pathology results.  Continue your present medications, including Prilosec.  Your physician has recommended a repeat colonoscopy in 10 years for screening purposes.    Upper Endoscopy, Adult, Care After This sheet gives you information about how to care for yourself after your procedure. Your health care provider may also give you more specific instructions. If you have problems or questions, contact your health care provider.  Dr Jenetta Downer:  [phone] What can I expect after the procedure? After the procedure, it is common to have:  A sore throat.  Mild stomach pain or discomfort.  Bloating.  Nausea. Follow these instructions at home:  Follow instructions from your health care provider about what to eat or drink after your procedure.  Return to your normal activities as told by your health care provider. Ask your health care provider what activities are safe for you.  Take over-the-counter and prescription medicines only as told by your health care provider.  If you were given a sedative during the procedure, it can affect you for several hours. Do not drive or operate machinery until your health care provider says that it is safe.  Keep all follow-up visits as told by your health care provider. This is important.   Contact a health care provider if you have:  A sore throat that lasts longer than one day.  Trouble swallowing. Get help right away if:  You vomit blood or your vomit looks like coffee grounds.  You have: ? A fever. ? Bloody, black, or tarry stools. ? A severe sore throat or you cannot swallow. ? Difficulty breathing. ? Severe pain in your chest or abdomen. Summary  After the procedure, it is common to have a sore throat, mild stomach discomfort, bloating, and nausea.  If you were given a sedative during the procedure, it can affect you for several hours. Do not drive  or operate machinery until your health care provider says that it is safe.  Follow instructions from your health care provider about what to eat or drink after your procedure.  Return to your normal activities as told by your health care provider. This information is not intended to replace advice given to you by your health care provider. Make sure you discuss any questions you have with your health care provider. Document Revised: 11/16/2019 Document Reviewed: 04/20/2018 Elsevier Patient Education  2021 [employer].   Colonoscopy, Adult, Care After This sheet gives you information about how to care for yourself after your procedure. Your health care provider may also give you more specific instructions. If you have problems or questions, contact your health care provider. What can I expect after the procedure? After the procedure, it is common to have:  A small amount of blood in your stool for 24 hours after the procedure.  Some gas.  Mild cramping or bloating of your abdomen. Follow these instructions at home: Eating and drinking  Drink enough fluid to keep your urine pale yellow.  Follow instructions from your health care provider about eating or drinking restrictions.  Resume your normal diet as instructed by your health care provider. Avoid heavy or fried foods that are hard to digest.   Activity  Rest as told by your health care provider.  Avoid sitting for a long time without moving. Get up to take short walks every 1-2 hours. This is important to improve blood flow and breathing. Ask for help  if you feel weak or unsteady.  Return to your normal activities as told by your health care provider. Ask your health care provider what activities are safe for you. Managing cramping and bloating  Try walking around when you have cramps or feel bloated.     General instructions  If you were given a sedative during the procedure, it can affect you for several hours. Do not  drive or operate machinery until your health care provider says that it is safe.  For the first 24 hours after the procedure: ? Do not sign important documents. ? Do not drink alcohol. ? Do your regular daily activities at a slower pace than normal. ? Eat soft foods that are easy to digest.  Take over-the-counter and prescription medicines only as told by your health care provider.  Keep all follow-up visits as told by your health care provider. This is important. Contact a health care provider if:  You have blood in your stool 2-3 days after the procedure. Get help right away if you have:  More than a small spotting of blood in your stool.  Large blood clots in your stool.  Swelling of your abdomen.  Nausea or vomiting.  A fever.  Increasing pain in your abdomen that is not relieved with medicine. Summary  After the procedure, it is common to have a small amount of blood in your stool. You may also have mild cramping and bloating of your abdomen.  If you were given a sedative during the procedure, it can affect you for several hours. Do not drive or operate machinery until your health care provider says that it is safe.  Get help right away if you have a lot of blood in your stool, nausea or vomiting, a fever, or increased pain in your abdomen. This information is not intended to replace advice given to you by your health care provider. Make sure you discuss any questions you have with your health care provider. Document Revised: 11/12/2019 Document Reviewed: 06/14/2019 Elsevier Patient Education  [place].

## 2021-03-01 ENCOUNTER — Other Ambulatory Visit (INDEPENDENT_AMBULATORY_CARE_PROVIDER_SITE_OTHER): Payer: Self-pay | Admitting: Gastroenterology

## 2021-03-01 DIAGNOSIS — R1319 Other dysphagia: Secondary | ICD-10-CM

## 2021-03-01 DIAGNOSIS — K219 Gastro-esophageal reflux disease without esophagitis: Secondary | ICD-10-CM

## 2021-03-01 DIAGNOSIS — K2270 Barrett's esophagus without dysplasia: Secondary | ICD-10-CM

## 2021-03-01 LAB — SURGICAL PATHOLOGY

## 2021-03-01 MED ORDER — OMEPRAZOLE 40 MG PO CPDR: 40.0000 mg | DELAYED_RELEASE_CAPSULE | Freq: Every day | ORAL | 3 refills | Status: DC

## 2021-03-05 ENCOUNTER — Encounter (HOSPITAL_COMMUNITY): Payer: Self-pay | Admitting: Gastroenterology

## 2021-04-04 ENCOUNTER — Other Ambulatory Visit (INDEPENDENT_AMBULATORY_CARE_PROVIDER_SITE_OTHER): Payer: Self-pay | Admitting: Gastroenterology

## 2021-04-04 ENCOUNTER — Telehealth (INDEPENDENT_AMBULATORY_CARE_PROVIDER_SITE_OTHER): Payer: Self-pay

## 2021-04-04 DIAGNOSIS — R112 Nausea with vomiting, unspecified: Secondary | ICD-10-CM

## 2021-04-04 MED ORDER — ONDANSETRON HCL 4 MG PO TABS: 4.0000 mg | ORAL_TABLET | Freq: Three times a day (TID) | ORAL | 1 refills | Status: DC | PRN

## 2021-04-04 NOTE — Telephone Encounter (Signed)
Patient called stating she has had episodes of nausea,vomiting, and diarrhea since her procedure. She states she has hyoscyamine,but needs something sent to [facility] for nausea and vomiting.

## 2021-04-04 NOTE — Telephone Encounter (Signed)
Patient aware.

## 2021-04-04 NOTE — Telephone Encounter (Signed)
She can continue on dicyclomine for diarrhea, will send Zofran as needed for nausea Thanks

## 2021-04-17 ENCOUNTER — Telehealth (INDEPENDENT_AMBULATORY_CARE_PROVIDER_SITE_OTHER): Payer: Self-pay

## 2021-04-17 ENCOUNTER — Other Ambulatory Visit (INDEPENDENT_AMBULATORY_CARE_PROVIDER_SITE_OTHER): Payer: Self-pay

## 2021-04-17 NOTE — Telephone Encounter (Addendum)
I advised that Dr. Jenetta Downer recommended she follow the Fodmap diet and to take imodium prn and Miralax prn and Levsin prn abdominal pain and diarrhea.She states understanding.  Patient called and states she is still having issues with her IBS. She has episodes of diarrhea alternating with Constipation bloating and gas that last for days. She states she hasn't been taking the imodium prn for diarrhea and she has not been following the FodMap diet and she has not been taking the Levsin as she feels it does not help. She states she feels bad as of late more than she feels good. Please advise.

## 2021-04-19 NOTE — Telephone Encounter (Signed)
I called the patient to discuss her symptoms but she did not pick up the phone, elevated body message explaining that it would be very important for her to follow her low, diet compliantly, as well as taking Imodium for episodes of diarrhea, MiraLAX for episodes of constipation, or Levsin if she presents any abdominal pain or diarrhea episodes.  It is important for her to follow the directions given during her previous appointments to improve her symptomatology.

## 2021-04-20 ENCOUNTER — Telehealth (INDEPENDENT_AMBULATORY_CARE_PROVIDER_SITE_OTHER): Payer: Self-pay | Admitting: *Deleted

## 2021-04-20 ENCOUNTER — Encounter (INDEPENDENT_AMBULATORY_CARE_PROVIDER_SITE_OTHER): Payer: Self-pay

## 2021-04-20 NOTE — Telephone Encounter (Signed)
Patient missed Dr C or his nurses call please call [phone]

## 2021-05-15 ENCOUNTER — Ambulatory Visit (INDEPENDENT_AMBULATORY_CARE_PROVIDER_SITE_OTHER): Payer: 59 | Admitting: Internal Medicine

## 2021-05-24 ENCOUNTER — Encounter (INDEPENDENT_AMBULATORY_CARE_PROVIDER_SITE_OTHER): Payer: Self-pay | Admitting: Gastroenterology

## 2021-05-24 ENCOUNTER — Ambulatory Visit (INDEPENDENT_AMBULATORY_CARE_PROVIDER_SITE_OTHER): Payer: 59 | Admitting: Gastroenterology

## 2021-05-24 ENCOUNTER — Other Ambulatory Visit: Payer: Self-pay

## 2021-05-24 VITALS — BP 131/83 | HR 75 | Temp 99.0°F | Ht 62.5 in | Wt 204.0 lb

## 2021-05-24 DIAGNOSIS — K589 Irritable bowel syndrome without diarrhea: Secondary | ICD-10-CM | POA: Insufficient documentation

## 2021-05-24 DIAGNOSIS — K580 Irritable bowel syndrome with diarrhea: Secondary | ICD-10-CM

## 2021-05-24 DIAGNOSIS — K219 Gastro-esophageal reflux disease without esophagitis: Secondary | ICD-10-CM

## 2021-05-24 DIAGNOSIS — K2270 Barrett's esophagus without dysplasia: Secondary | ICD-10-CM | POA: Diagnosis not present

## 2021-05-24 NOTE — Progress Notes (Signed)
[name], M.D. Gastroenterology & Hepatology [facility] [address] [place], [place] [address]  Primary Care Physician: Manon Hilding, MD [place] [address]  I will communicate my assessment and recommendations to the referring MD via EMR.  Problems:  IBS-D GERD Barrett's esophagus  History of Present Illness: Deborah Humphrey is a 45 y.o. 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  The patient was last seen on 02/19/2021. At that time, the patient was found to undergo an EGD and a colonoscopy.  Had negative serology for celiac disease.  She was advised to take Imodium and MiraLAX depending on the consistency of her bowel movements and the presence of constipation.  She was also advised to follow a low FODMAP diet.  Patient reports that overall her symptoms have improved.  Patient started taking Culturelle a month ago, reports that this has led to improvement of her abdominal pain and bloating episodes. Has only had one episode of abdominal pain the left side of her abdomen since she had her last visit in the clinic. Had some bloating at that time but this has not recurred.  Overall, her abdominal pain has been better than in the past. She took Levsin very seldom in the past, it did not help too much to relieve her symptoms. She has tried the low FODMAP diet, has tried to tailor it as much as possible to her previous lifestyle but is not 100% strict with it.  She has had signifciant stress in her life as she has 2 adopted sons that have special requirements and she believes these caused a significant toll in her stress level.  Had two episodes of dyphagia since her last EGD, but she is not too concerned as she thinks she ate big pieces of food. She states that this is less often than in the past.  Overall, she reports that her  heartburn is well controlled on her acid reflux medication.  She has some soft Bms usually, but usually has 1-2 Bms.  The patient denies having any nausea, vomiting, fever, chills, hematochezia, melena, hematemesis, 02/28/2021, changes concerning for Barrett's esophagus with maximum extension 2 cm (3 times) which was confirmed with biopsies.  Normal stomach diarrhea, jaundice, pruritus or weight loss.  Last EGD: 02/28/2021, 3 tongues of salmon-colored mucosa suggestive of Barrett's esophagus extending for 2 cm, confirmed with biopsies.  Normal stomach and small bowel. With normal biopsies of the small bowel.   Last Colonoscopy: 02/28/2021, normal colon with neg biopsies for microscopic colitis.  Past Medical History: Past Medical History:  Diagnosis Date   Cervical spine fracture ([place])    Denies havign surgery    gestational hypertention    gestational hypertension   Helicobacter pylori (H. pylori)    Infertility, female     Past Surgical History: Past Surgical History:  Procedure Laterality Date   BIOPSY  02/28/2021   Procedure: BIOPSY;  Surgeon: Harvel Quale, MD;  [facility];  Service: Gastroenterology;;   CESAREAN SECTION     COLONOSCOPY WITH PROPOFOL N/A 02/28/2021   Procedure: COLONOSCOPY WITH PROPOFOL;  Surgeon: Harvel Quale, MD;  Location: [facility];  Service: Gastroenterology;  Laterality: N/A;  AM   DILATATION AND CURETTAGE/HYSTEROSCOPY WITH MINERVA N/A 08/22/2020   Procedure: DILATATION AND CURETTAGE/HYSTEROSCOPY WITH MINERVA;  Surgeon: Jonnie Kind, MD;  Location:  [facility];  Service: Gynecology;  Laterality: N/A;   DILATION AND CURETTAGE OF UTERUS     x3   ESOPHAGOGASTRODUODENOSCOPY (EGD) WITH PROPOFOL N/A 02/28/2021   Procedure: ESOPHAGOGASTRODUODENOSCOPY (EGD) WITH PROPOFOL;  Surgeon: Harvel Quale, MD;  [facility]: [facility];  Service: Gastroenterology;  Laterality: N/A;   HALO APPLICATION     age 56   LAPAROSCOPIC  BILATERAL SALPINGECTOMY Bilateral 08/22/2020   Procedure: LAPAROSCOPIC BILATERAL SALPINGECTOMY;  Surgeon: Jonnie Kind, MD;  Location: [facility];  Service: Gynecology;  Laterality: Bilateral;    Family History: Family History  Problem Relation Age of Onset   Cancer Paternal Grandfather        pancreatic?   Cancer Paternal Grandmother        liver   Dementia Maternal Grandmother    Cancer Father        pancreatic   Cancer Mother        skin   Breast cancer Sister    Polycystic ovary syndrome Sister    Endometriosis Sister    Breast cancer Sister    Breast cancer Paternal Aunt     Social History: Social History   Tobacco Use  Smoking Status Former   Packs/day: 1.00   Years: 15.00   Pack years: 15.00   Types: Cigarettes   Quit date: 01/31/2011   Years since quitting: 10.3  Smokeless Tobacco Never   Social History   Substance and Sexual Activity  Alcohol Use Yes   Comment: occassional   Social History   Substance and Sexual Activity  Drug Use No    Allergies: Allergies  Allergen Reactions   Bee Venom Swelling    Medications: Current Outpatient Medications  Medication Sig Dispense Refill   acetaminophen (TYLENOL) 500 MG tablet Take 1,000 mg by mouth every 6 (six) hours as needed for moderate pain.     fluvoxaMINE (LUVOX) 50 MG tablet Take 50 mg by mouth at bedtime.     hyoscyamine (LEVSIN) 0.125 MG tablet Take 1 tablet (0.125 mg total) by mouth every 4 (four) hours as needed. (Patient taking differently: Take 0.125 mg by mouth every 4 (four) hours as needed (ABDOMINAL SPASMS).) 30 tablet 0   Lactobacillus-Inulin (CULTURELLE ADULT ULT BALANCE PO) Take by mouth daily.     omeprazole (PRILOSEC) 40 MG capsule Take 1 capsule (40 mg total) by mouth daily before breakfast. 90 capsule 3   ondansetron (ZOFRAN) 4 MG tablet Take 1 tablet (4 mg total) by mouth every 8 (eight) hours as needed for nausea or vomiting. 30 tablet 1   No current facility-administered  medications for this visit.    Review of Systems: GENERAL: negative for malaise, night sweats HEENT: No changes in hearing or vision, no nose bleeds or other nasal problems. NECK: Negative for lumps, goiter, pain and significant neck swelling RESPIRATORY: Negative for cough, wheezing CARDIOVASCULAR: Negative for chest pain, leg swelling, palpitations, orthopnea GI: SEE HPI MUSCULOSKELETAL: Negative for joint pain or swelling, back pain, and muscle pain. SKIN: Negative for lesions, rash PSYCH: Negative for sleep disturbance, mood disorder and recent psychosocial stressors. HEMATOLOGY Negative for prolonged bleeding, bruising easily, and swollen nodes. ENDOCRINE: Negative for cold or heat intolerance, polyuria, polydipsia and goiter. NEURO: negative for tremor, gait imbalance, syncope and seizures. The remainder of the review of systems is noncontributory.   Physical Exam: BP 131/83 (BP Location: Right Arm, Patient Position: Sitting, Cuff Size: Large)   Pulse 75   Temp 99 F (37.2 C) (Oral)   Ht 5'  2.5" (1.588 m)   Wt 204 lb (92.5 kg)   BMI 36.72 kg/m  GENERAL: The patient is AO x3, in no acute distress. HEENT: Head is normocephalic and atraumatic. EOMI are intact. Mouth is well hydrated and without lesions. NECK: Supple. No masses LUNGS: Clear to auscultation. No presence of rhonchi/wheezing/rales. Adequate chest expansion HEART: RRR, normal s1 and s2. ABDOMEN: Soft, nontender, no guarding, no peritoneal signs, and nondistended. BS +. No masses. EXTREMITIES: Without any cyanosis, clubbing, rash, lesions or edema. NEUROLOGIC: AOx3, no focal motor deficit. SKIN: no jaundice, no rashes  Imaging/Labs: as above  I personally reviewed and interpreted the available labs, imaging and endoscopic files.  Impression and Plan: Deborah Humphrey is a 45 y.o. 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  The patient has presented lifestyle modifications which she has implemented.  She can continue taking the probiotics and can benefit from taking Levsin as needed if she has more episodes of abdominal pain but this is infrequent.  She may benefit from having a more stric low FODMAP diet, but if he uses Beano and Plan Enzymes she may be more liberal with her diet.  She can continue using omeprazole 40 mg every day to control her GERD episodes and she will need to have a repeat EGD 3 years after her most recent endoscopy for surveillance of Barrett's esophagus.  -Continue probiotics daily -Can take Imodium as needed if >4 bowel movements per day -Continue with dietary recommendations -Can take Levsin as needed for abdominal pain episodes -Continue omeprazole 40 mg qday -Can take Beano as needed when eating beans -When eating vegetables, can take Plant Enzymes -Repeat EGD in 3 years  All questions were answered.      Harvel Quale, MD Gastroenterology and Hepatology [facility]

## 2021-05-24 NOTE — Patient Instructions (Addendum)
Continue probiotics daily Can take Imodium as needed if >4 bowel movements per day Continue with dietary recommendations Can take Levsin as needed for abdominal pain episodes Continue omeprazole 40 mg qday Can take Beano as needed when eating beans When eating vegetables, can take Plant Enzymes

## 2021-09-03 ENCOUNTER — Encounter (INDEPENDENT_AMBULATORY_CARE_PROVIDER_SITE_OTHER): Payer: Self-pay

## 2021-09-04 ENCOUNTER — Other Ambulatory Visit (INDEPENDENT_AMBULATORY_CARE_PROVIDER_SITE_OTHER): Payer: Self-pay | Admitting: Gastroenterology

## 2021-09-04 DIAGNOSIS — K589 Irritable bowel syndrome without diarrhea: Secondary | ICD-10-CM

## 2021-09-04 MED ORDER — HYOSCYAMINE SULFATE ER 0.375 MG PO TB12: 0.3750 mg | ORAL_TABLET | Freq: Two times a day (BID) | ORAL | 2 refills | Status: DC

## 2021-11-19 ENCOUNTER — Ambulatory Visit (INDEPENDENT_AMBULATORY_CARE_PROVIDER_SITE_OTHER): Payer: 59 | Admitting: Gastroenterology

## 2021-11-22 ENCOUNTER — Ambulatory Visit (INDEPENDENT_AMBULATORY_CARE_PROVIDER_SITE_OTHER): Payer: 59 | Admitting: Gastroenterology

## 2021-11-22 ENCOUNTER — Encounter (INDEPENDENT_AMBULATORY_CARE_PROVIDER_SITE_OTHER): Payer: Self-pay | Admitting: Gastroenterology

## 2021-11-22 ENCOUNTER — Other Ambulatory Visit: Payer: Self-pay

## 2021-11-22 VITALS — BP 128/85 | HR 82 | Temp 98.4°F | Ht 63.0 in | Wt 200.2 lb

## 2021-11-22 DIAGNOSIS — K580 Irritable bowel syndrome with diarrhea: Secondary | ICD-10-CM

## 2021-11-22 NOTE — Patient Instructions (Signed)
Continue with hyoscyamine as needed for abdominal pain Continue lorazepam as needed for stress episodes Please discuss with your PCP regarding medications for anxiety such as SSRIs or low dose TCAs

## 2021-11-22 NOTE — Progress Notes (Signed)
Deborah Humphrey, M.D. Gastroenterology & Hepatology [facility] [address] [place], [place] [address]  Primary Care Physician: Manon Hilding, MD [place] [address]  I will communicate my assessment and recommendations to the referring MD via EMR.  Problems:  IBS-D GERD Barrett's esophagus  History of Present Illness: Deborah Humphrey is a 45 y.o. 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  The patient was last seen on 05/24/2021. At that time, the patient was advised to continue probiotics, Imodium as needed and Levsin for abdominal pain episodes.  She was also advised to continue omeprazole 40 mg every day for GERD.  She was also given dietary recommendations.  Patient reports she has been feeling slightly better after starting lorazepam 0.5 mg, as she believes a lot of her symptoms were related to stress. She states she started it a month ago, has been taking it as needed - takes it almost once a day. Only had one episode of abdominal pain since starting the medication which lasted close to a week. She reports that she is still having 1-2 Bms per day which soft to loose.The patient denies having any nausea, vomiting, fever, chills, hematochezia, melena, hematemesis,  jaundice, pruritus or weight changes.  Reports that her abdominal pain episodes are infrequent and she believes that taking the omeprazole and the Levsin as needed has helped for the pain.  Denies any heartburn or dysphagia.  She has been under significant stress as her two children were diagnosed with autism.  Last EGD: 02/28/2021, 3 tongues of salmon-colored mucosa suggestive of Barrett's esophagus extending for 2 cm, confirmed with biopsies.  Normal stomach and small bowel. With normal biopsies of the small bowel.   Last Colonoscopy: 02/28/2021, normal colon with neg  biopsies for microscopic colitis.  Past Medical History: Past Medical History:  Diagnosis Date   Cervical spine fracture ([place])    Denies havign surgery    gestational hypertention    gestational hypertension   Helicobacter pylori (H. pylori)    Infertility, female     Past Surgical History: Past Surgical History:  Procedure Laterality Date   BIOPSY  02/28/2021   Procedure: BIOPSY;  Surgeon: Harvel Quale, MD;  [facility];  Service: Gastroenterology;;   CESAREAN SECTION     COLONOSCOPY WITH PROPOFOL N/A 02/28/2021   Procedure: COLONOSCOPY WITH PROPOFOL;  Surgeon: Harvel Quale, MD;  Location: [facility];  Service: Gastroenterology;  Laterality: N/A;  AM   DILATATION AND CURETTAGE/HYSTEROSCOPY WITH MINERVA N/A 08/22/2020   Procedure: DILATATION AND CURETTAGE/HYSTEROSCOPY WITH MINERVA;  Surgeon: Jonnie Kind, MD;  Location: [facility];  Service: Gynecology;  Laterality: N/A;   DILATION AND CURETTAGE OF UTERUS     x3   ESOPHAGOGASTRODUODENOSCOPY (EGD) WITH PROPOFOL N/A 02/28/2021   Procedure: ESOPHAGOGASTRODUODENOSCOPY (EGD) WITH PROPOFOL;  Surgeon: Harvel Quale, MD;  Location: [facility];  Service: Gastroenterology;  Laterality: N/A;   HALO APPLICATION     age 7   LAPAROSCOPIC BILATERAL SALPINGECTOMY Bilateral 08/22/2020   Procedure: LAPAROSCOPIC BILATERAL SALPINGECTOMY;  Surgeon: Jonnie Kind, MD;  Location: [facility];  Service: Gynecology;  Laterality: Bilateral;    Family History: Family History  Problem Relation Age of Onset   Cancer Paternal Grandfather        pancreatic?   Cancer Paternal Grandmother  liver   Dementia Maternal Grandmother    Cancer Father        pancreatic   Cancer Mother        skin   Breast cancer Sister    Polycystic ovary syndrome Sister    Endometriosis Sister    Breast cancer Sister    Breast cancer Paternal Aunt     Social History: Social History   Tobacco Use  Smoking Status  Former   Packs/day: 1.00   Years: 15.00   Pack years: 15.00   Types: Cigarettes   Quit date: 01/31/2011   Years since quitting: 10.8  Smokeless Tobacco Never   Social History   Substance and Sexual Activity  Alcohol Use Yes   Comment: occassional   Social History   Substance and Sexual Activity  Drug Use No    Allergies: Allergies  Allergen Reactions   Bee Venom Swelling    Medications: Current Outpatient Medications  Medication Sig Dispense Refill   acetaminophen (TYLENOL) 500 MG tablet Take 1,000 mg by mouth every 6 (six) hours as needed for moderate pain.     hyoscyamine (LEVBID) 0.375 MG 12 hr tablet Take 1 tablet (0.375 mg total) by mouth 2 (two) times daily. 60 tablet 2   Lactobacillus-Inulin (CULTURELLE ADULT ULT BALANCE PO) Take by mouth daily.     LORazepam (ATIVAN) 0.5 MG tablet Take 0.5 mg by mouth 2 (two) times daily.     omeprazole (PRILOSEC) 40 MG capsule Take 1 capsule (40 mg total) by mouth daily before breakfast. 90 capsule 3   ondansetron (ZOFRAN) 4 MG tablet Take 1 tablet (4 mg total) by mouth every 8 (eight) hours as needed for nausea or vomiting. 30 tablet 1   No current facility-administered medications for this visit.    Review of Systems: GENERAL: negative for malaise, night sweats HEENT: No changes in hearing or vision, no nose bleeds or other nasal problems. NECK: Negative for lumps, goiter, pain and significant neck swelling RESPIRATORY: Negative for cough, wheezing CARDIOVASCULAR: Negative for chest pain, leg swelling, palpitations, orthopnea GI: SEE HPI MUSCULOSKELETAL: Negative for joint pain or swelling, back pain, and muscle pain. SKIN: Negative for lesions, rash PSYCH: Negative for sleep disturbance, mood disorder and recent psychosocial stressors. HEMATOLOGY Negative for prolonged bleeding, bruising easily, and swollen nodes. ENDOCRINE: Negative for cold or heat intolerance, polyuria, polydipsia and goiter. NEURO: negative for  tremor, gait imbalance, syncope and seizures. The remainder of the review of systems is noncontributory.   Physical Exam: BP 128/85 (BP Location: Left Arm, Patient Position: Sitting, Cuff Size: Large)    Pulse 82    Temp 98.4 F (36.9 C) (Oral)    Ht 5\' 3"  (1.6 m)    Wt 200 lb 3.2 oz (90.8 kg)    BMI 35.46 kg/m  GENERAL: The patient is AO x3, in no acute distress. HEENT: Head is normocephalic and atraumatic. EOMI are intact. Mouth is well hydrated and without lesions. NECK: Supple. No masses LUNGS: Clear to auscultation. No presence of rhonchi/wheezing/rales. Adequate chest expansion HEART: RRR, normal s1 and s2. ABDOMEN: Soft, nontender, no guarding, no peritoneal signs, and nondistended. BS +. No masses. EXTREMITIES: Without any cyanosis, clubbing, rash, lesions or edema. NEUROLOGIC: AOx3, no focal motor deficit. SKIN: no jaundice, no rashes  Imaging/Labs: as above  I personally reviewed and interpreted the available labs, imaging and endoscopic files.  Impression and Plan: Deborah Humphrey is a 45 y.o. 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  Overall, the patient has improved in terms of the frequency and severity of her symptoms.  I do consider that there is an important brain gut axis leading to her symptoms as stress leads to exacerbation of her symptomatology.  I counseled her about the importance of obtaining mental health management by her PCP, can consider starting an SSRI or TCA to improve her symptoms so she can be spared from taking benzodiazepines on a daily basis.  For now she can continue taking the Levsin as needed and omeprazole on a daily basis.  Overall her GERD has been well controlled.  - Continue with hyoscyamine as needed for abdominal pain - Continue lorazepam as needed for stress episodes -Patient will discuss with PCP regarding medications for anxiety such as SSRIs or low  dose TCAs -May consider switching regiment to Librax depending on symptom control.  All questions were answered.      Harvel Quale, MD Gastroenterology and Hepatology [facility]

## 2021-12-28 ENCOUNTER — Other Ambulatory Visit (HOSPITAL_COMMUNITY)
Admission: RE | Admit: 2021-12-28 | Discharge: 2021-12-28 | Disposition: A | Discharge status: Home / Self Care | Payer: 59 | Source: Ambulatory Visit | Attending: Family Medicine | Admitting: Family Medicine

## 2021-12-28 DIAGNOSIS — Z77011 Contact with and (suspected) exposure to lead: Secondary | ICD-10-CM | POA: Insufficient documentation

## 2021-12-31 LAB — LEAD, BLOOD (ADULT >= 16 YRS): Lead-Whole Blood: <1 ug/dL (ref 0.0–3.4)

## 2022-01-15 ENCOUNTER — Encounter (INDEPENDENT_AMBULATORY_CARE_PROVIDER_SITE_OTHER): Payer: Self-pay | Admitting: Gastroenterology

## 2022-01-16 ENCOUNTER — Other Ambulatory Visit (INDEPENDENT_AMBULATORY_CARE_PROVIDER_SITE_OTHER): Payer: Self-pay | Admitting: Gastroenterology

## 2022-01-16 DIAGNOSIS — K589 Irritable bowel syndrome without diarrhea: Secondary | ICD-10-CM

## 2022-01-16 MED ORDER — CILIDINIUM-CHLORDIAZEPOXIDE 2.5-5 MG PO CAPS: 1.0000 | ORAL_CAPSULE | Freq: Three times a day (TID) | ORAL | 3 refills | Status: DC

## 2022-01-18 ENCOUNTER — Other Ambulatory Visit (INDEPENDENT_AMBULATORY_CARE_PROVIDER_SITE_OTHER): Payer: Self-pay | Admitting: Gastroenterology

## 2022-01-18 DIAGNOSIS — K589 Irritable bowel syndrome without diarrhea: Secondary | ICD-10-CM

## 2022-01-18 MED ORDER — CILIDINIUM-CHLORDIAZEPOXIDE 2.5-5 MG PO CAPS: 1.0000 | ORAL_CAPSULE | Freq: Three times a day (TID) | ORAL | 3 refills | Status: DC

## 2022-01-23 ENCOUNTER — Other Ambulatory Visit (INDEPENDENT_AMBULATORY_CARE_PROVIDER_SITE_OTHER): Payer: Self-pay | Admitting: Gastroenterology

## 2022-01-23 DIAGNOSIS — K589 Irritable bowel syndrome without diarrhea: Secondary | ICD-10-CM

## 2022-01-23 MED ORDER — CILIDINIUM-CHLORDIAZEPOXIDE 2.5-5 MG PO CAPS: 1.0000 | ORAL_CAPSULE | Freq: Three times a day (TID) | ORAL | 3 refills | Status: DC

## 2022-01-23 NOTE — Telephone Encounter (Signed)
Medication sent.

## 2022-01-28 ENCOUNTER — Encounter: Payer: Self-pay | Admitting: Adult Health

## 2022-01-28 ENCOUNTER — Ambulatory Visit (INDEPENDENT_AMBULATORY_CARE_PROVIDER_SITE_OTHER): Payer: 59 | Admitting: Adult Health

## 2022-01-28 ENCOUNTER — Other Ambulatory Visit: Payer: Self-pay

## 2022-01-28 VITALS — BP 124/86 | HR 82 | Ht 62.5 in | Wt 209.5 lb

## 2022-01-28 DIAGNOSIS — Z01419 Encounter for gynecological examination (general) (routine) without abnormal findings: Secondary | ICD-10-CM | POA: Insufficient documentation

## 2022-01-28 DIAGNOSIS — K649 Unspecified hemorrhoids: Secondary | ICD-10-CM | POA: Diagnosis not present

## 2022-01-28 DIAGNOSIS — Z1211 Encounter for screening for malignant neoplasm of colon: Secondary | ICD-10-CM

## 2022-01-28 LAB — HEMOCCULT GUIAC POC 1CARD (OFFICE): Fecal Occult Blood, POC: NEGATIVE

## 2022-01-28 NOTE — Progress Notes (Signed)
Patient ID: Deborah Humphrey, female   DOB: [birth year], 46 y.o.   MRN: [id number] History of Present Illness: Deborah Humphrey is a 46 year old white female, married, G1P1 in for well woman gyn exam.  Lab Results  Component Value Date   DIAGPAP  01/25/2021    - Negative for intraepithelial lesion or malignancy (NILM)   [place] Negative 01/25/2021   PCP is Dr Quintin Alto  Current Medications, Allergies, Past Medical History, Past Surgical History, Family History and Social History were reviewed in [facility] record.     Review of Systems: Patient denies any headaches, hearing loss, fatigue, blurred vision, shortness of breath, chest pain, problems with urination, or intercourse. No joint pain or mood swings.  Still has pain LLQ but may be better, is on librax now, from GI Has episodes of diarrhea and constipation +Stress and she thinks the stress affects her pain And BMs Denies any vaginal bleeding   Physical Exam:BP 124/86 (BP Location: Right Arm, Patient Position: Sitting, Cuff Size: Normal)    Pulse 82    Ht 5' 2.5" (1.588 m)    Wt 209 lb 8 oz (95 kg)    BMI 37.71 kg/m   General:  Well developed, well nourished, no acute distress Skin:  Warm and dry Neck:  Midline trachea, normal thyroid, good ROM, no lymphadenopathy Lungs; Clear to auscultation bilaterally Breast:  No dominant palpable mass, retraction, or nipple discharge Cardiovascular: Regular rate and rhythm Abdomen:  Soft, non tender, no hepatosplenomegaly Pelvic:  External genitalia is normal in appearance, no lesions.  The vagina is normal in appearance. Urethra has no lesions or masses. The cervix is bulbous.  Uterus is felt to be normal size, shape, and contour.  No adnexal masses or tenderness noted.Bladder is non tender, no masses felt. Rectal: Good sphincter tone, no polyps, + hemorrhoids felt.  Hemoccult negative. Extremities/musculoskeletal:  No swelling or varicosities noted, no clubbing or cyanosis Psych:  No  mood changes, alert and cooperative,seems happy AA is 3 Fall risk is low Depression screen [facility] 2/9 01/28/2022 01/25/2021 06/21/2020  Decreased Interest 0 0 0  Down, Depressed, Hopeless 0 0 0  PHQ - 2 Score 0 0 0  Altered sleeping 0 0 2  Tired, decreased energy 1 0 1  Change in appetite 0 1 1  Feeling bad or failure about yourself  0 0 0  Trouble concentrating 1 0 1  Moving slowly or fidgety/restless 0 0 0  Suicidal thoughts 0 0 0  PHQ-9 Score 2 1 5     GAD 7 : Generalized Anxiety Score 01/28/2022 01/25/2021 06/21/2020  Nervous, Anxious, on Edge 1 0 1  Control/stop worrying 2 0 1  Worry too much - different things 2 0 1  Trouble relaxing 1 0 0  Restless 0 1 0  Easily annoyed or irritable 2 0 0  Afraid - awful might happen 0 0 0  Total GAD 7 Score 8 1 3       Upstream - 01/28/22 1039       Pregnancy Intention Screening   Does the patient want to become pregnant in the next year? No    Does the patient's partner want to become pregnant in the next year? No    Would the patient like to discuss contraceptive options today? No      Contraception Wrap Up   Current Method Female Sterilization    End Method Female Sterilization    Contraception Counseling Provided No  Examination chaperoned by Celene Squibb LPN   Impression and Plan: 1. Encounter for well woman exam with routine gynecological exam Physical in 1 year Pap in 2025 Mammogram yearly Colonoscopy per GI  2. Encounter for screening fecal occult blood testing  3. Hemorrhoids, unspecified hemorrhoid type

## 2022-02-11 ENCOUNTER — Encounter (HOSPITAL_COMMUNITY): Payer: Self-pay | Admitting: Emergency Medicine

## 2022-02-11 ENCOUNTER — Encounter (INDEPENDENT_AMBULATORY_CARE_PROVIDER_SITE_OTHER): Payer: Self-pay | Admitting: Gastroenterology

## 2022-02-11 ENCOUNTER — Inpatient Hospital Stay (HOSPITAL_COMMUNITY)
Admission: EM | Admit: 2022-02-11 | Discharge: 2022-02-13 | DRG: 761 | Disposition: A | Discharge status: Home / Self Care | Payer: 59 | Attending: Internal Medicine | Admitting: Internal Medicine

## 2022-02-11 ENCOUNTER — Other Ambulatory Visit: Payer: Self-pay

## 2022-02-11 ENCOUNTER — Emergency Department (HOSPITAL_COMMUNITY): Payer: 59

## 2022-02-11 DIAGNOSIS — N809 Endometriosis, unspecified: Secondary | ICD-10-CM | POA: Diagnosis not present

## 2022-02-11 DIAGNOSIS — K580 Irritable bowel syndrome with diarrhea: Secondary | ICD-10-CM | POA: Diagnosis present

## 2022-02-11 DIAGNOSIS — K589 Irritable bowel syndrome without diarrhea: Secondary | ICD-10-CM | POA: Diagnosis present

## 2022-02-11 DIAGNOSIS — N9489 Other specified conditions associated with female genital organs and menstrual cycle: Secondary | ICD-10-CM | POA: Diagnosis present

## 2022-02-11 DIAGNOSIS — K219 Gastro-esophageal reflux disease without esophagitis: Secondary | ICD-10-CM | POA: Diagnosis present

## 2022-02-11 DIAGNOSIS — Z20822 Contact with and (suspected) exposure to covid-19: Secondary | ICD-10-CM | POA: Diagnosis present

## 2022-02-11 DIAGNOSIS — K529 Noninfective gastroenteritis and colitis, unspecified: Secondary | ICD-10-CM | POA: Diagnosis present

## 2022-02-11 DIAGNOSIS — Z87891 Personal history of nicotine dependence: Secondary | ICD-10-CM

## 2022-02-11 DIAGNOSIS — Z79899 Other long term (current) drug therapy: Secondary | ICD-10-CM

## 2022-02-11 DIAGNOSIS — R933 Abnormal findings on diagnostic imaging of other parts of digestive tract: Secondary | ICD-10-CM

## 2022-02-11 DIAGNOSIS — Z91030 Bee allergy status: Secondary | ICD-10-CM

## 2022-02-11 DIAGNOSIS — K5792 Diverticulitis of intestine, part unspecified, without perforation or abscess without bleeding: Secondary | ICD-10-CM | POA: Diagnosis present

## 2022-02-11 DIAGNOSIS — F419 Anxiety disorder, unspecified: Secondary | ICD-10-CM | POA: Diagnosis present

## 2022-02-11 DIAGNOSIS — K2270 Barrett's esophagus without dysplasia: Secondary | ICD-10-CM | POA: Diagnosis present

## 2022-02-11 DIAGNOSIS — F429 Obsessive-compulsive disorder, unspecified: Secondary | ICD-10-CM | POA: Diagnosis present

## 2022-02-11 DIAGNOSIS — Z87898 Personal history of other specified conditions: Secondary | ICD-10-CM

## 2022-02-11 DIAGNOSIS — Z803 Family history of malignant neoplasm of breast: Secondary | ICD-10-CM

## 2022-02-11 DIAGNOSIS — R1310 Dysphagia, unspecified: Secondary | ICD-10-CM

## 2022-02-11 DIAGNOSIS — R188 Other ascites: Secondary | ICD-10-CM

## 2022-02-11 LAB — COMPREHENSIVE METABOLIC PANEL
ALT: 19 U/L (ref 0–44)
AST: 24 U/L (ref 15–41)
Albumin: 4 g/dL (ref 3.5–5.0)
Alkaline Phosphatase: 80 U/L (ref 38–126)
Anion gap: 10 (ref 5–15)
BUN: 10 mg/dL (ref 6–20)
CO2: 19 mmol/L — ABNORMAL LOW (ref 22–32)
Calcium: 8.7 mg/dL — ABNORMAL LOW (ref 8.9–10.3)
Chloride: 106 mmol/L (ref 98–111)
Creatinine, Ser: 0.89 mg/dL (ref 0.44–1.00)
GFR, Estimated: >60 mL/min (ref >60)
Glucose, Bld: 123 mg/dL — ABNORMAL HIGH (ref 70–99)
Potassium: 3.6 mmol/L (ref 3.5–5.1)
Sodium: 135 mmol/L (ref 135–145)
Total Bilirubin: 0.6 mg/dL (ref 0.3–1.2)
Total Protein: 7.1 g/dL (ref 6.5–8.1)

## 2022-02-11 LAB — URINALYSIS, ROUTINE W REFLEX MICROSCOPIC
Bacteria, UA: NONE SEEN
Bilirubin Urine: NEGATIVE
Glucose, UA: NEGATIVE mg/dL
Ketones, ur: NEGATIVE mg/dL
Leukocytes,Ua: NEGATIVE
Nitrite: NEGATIVE
Protein, ur: NEGATIVE mg/dL
Specific Gravity, Urine: 1.008 (ref 1.005–1.030)
pH: 5 (ref 5.0–8.0)

## 2022-02-11 LAB — CBC
HCT: 43.1 % (ref 36.0–46.0)
Hemoglobin: 14.8 g/dL (ref 12.0–15.0)
MCH: 30.6 pg (ref 26.0–34.0)
MCHC: 34.3 g/dL (ref 30.0–36.0)
MCV: 89.2 fL (ref 80.0–100.0)
Platelets: 187 10*3/uL (ref 150–400)
RBC: 4.83 MIL/uL (ref 3.87–5.11)
RDW: 12.5 % (ref 11.5–15.5)
WBC: 7.2 10*3/uL (ref 4.0–10.5)
nRBC: 0 % (ref 0.0–0.2)

## 2022-02-11 LAB — RESP PANEL BY RT-PCR (FLU A&B, COVID) ARPGX2
Influenza A by PCR: NEGATIVE
Influenza B by PCR: NEGATIVE
SARS Coronavirus 2 by RT PCR: NEGATIVE

## 2022-02-11 LAB — POC URINE PREG, ED: Preg Test, Ur: NEGATIVE

## 2022-02-11 LAB — LIPASE, BLOOD: Lipase: 32 U/L (ref 11–51)

## 2022-02-11 MED ORDER — RISAQUAD PO CAPS
1.0000 | ORAL_CAPSULE | Freq: Every day | ORAL | Status: DC
  Administered 2022-02-12 – 2022-02-13 (×2): 1 via ORAL
  Filled 2022-02-11 (×4): qty 1

## 2022-02-11 MED ORDER — ONDANSETRON HCL 4 MG/2ML IJ SOLN: 4.0000 mg | Freq: Four times a day (QID) | INTRAMUSCULAR | Status: DC | PRN

## 2022-02-11 MED ORDER — ACETAMINOPHEN 325 MG PO TABS: 650.0000 mg | ORAL_TABLET | Freq: Four times a day (QID) | ORAL | Status: DC | PRN

## 2022-02-11 MED ORDER — MORPHINE SULFATE (PF) 2 MG/ML IV SOLN
2.0000 mg | Freq: Once | INTRAVENOUS | Status: AC
  Administered 2022-02-11: 2 mg via INTRAVENOUS
  Filled 2022-02-11: qty 1

## 2022-02-11 MED ORDER — ONDANSETRON HCL 4 MG/2ML IJ SOLN
4.0000 mg | Freq: Once | INTRAMUSCULAR | Status: AC
  Administered 2022-02-11: 4 mg via INTRAVENOUS
  Filled 2022-02-11: qty 2

## 2022-02-11 MED ORDER — CULTURELLE ADULT ULT BALANCE PO CAPS: ORAL_CAPSULE | Freq: Every day | ORAL | Status: DC

## 2022-02-11 MED ORDER — SODIUM CHLORIDE 0.9 % IV BOLUS
1000.0000 mL | Freq: Once | INTRAVENOUS | Status: AC
  Administered 2022-02-11: 1000 mL via INTRAVENOUS

## 2022-02-11 MED ORDER — ONDANSETRON HCL 4 MG PO TABS: 4.0000 mg | ORAL_TABLET | Freq: Four times a day (QID) | ORAL | Status: DC | PRN

## 2022-02-11 MED ORDER — IOHEXOL 300 MG/ML  SOLN
100.0000 mL | Freq: Once | INTRAMUSCULAR | Status: AC | PRN
  Administered 2022-02-11: 100 mL via INTRAVENOUS

## 2022-02-11 MED ORDER — SODIUM CHLORIDE 0.9% FLUSH
3.0000 mL | INTRAVENOUS | Status: DC | PRN
Start: 2022-02-11 — End: 2022-02-13

## 2022-02-11 MED ORDER — CIPROFLOXACIN IN D5W 400 MG/200ML IV SOLN
400.0000 mg | Freq: Two times a day (BID) | INTRAVENOUS | Status: DC
Start: 2022-02-11 — End: 2022-02-13
  Administered 2022-02-11 – 2022-02-13 (×4): 400 mg via INTRAVENOUS
  Filled 2022-02-11 (×4): qty 200

## 2022-02-11 MED ORDER — CILIDINIUM-CHLORDIAZEPOXIDE 2.5-5 MG PO CAPS: 1.0000 | ORAL_CAPSULE | Freq: Three times a day (TID) | ORAL | Status: DC

## 2022-02-11 MED ORDER — MORPHINE SULFATE (PF) 4 MG/ML IV SOLN
4.0000 mg | Freq: Once | INTRAVENOUS | Status: AC
  Administered 2022-02-11: 4 mg via INTRAVENOUS
  Filled 2022-02-11: qty 1

## 2022-02-11 MED ORDER — AMITRIPTYLINE HCL 10 MG PO TABS
10.0000 mg | ORAL_TABLET | Freq: Every day | ORAL | Status: DC
  Administered 2022-02-11 – 2022-02-12 (×2): 10 mg via ORAL
  Filled 2022-02-11 (×4): qty 1

## 2022-02-11 MED ORDER — METRONIDAZOLE 500 MG/100ML IV SOLN
500.0000 mg | Freq: Two times a day (BID) | INTRAVENOUS | Status: DC
  Administered 2022-02-11 – 2022-02-12 (×2): 500 mg via INTRAVENOUS
  Filled 2022-02-11 (×2): qty 100

## 2022-02-11 MED ORDER — PANTOPRAZOLE SODIUM 40 MG PO TBEC
40.0000 mg | DELAYED_RELEASE_TABLET | Freq: Every day | ORAL | Status: DC
Start: 2022-02-12 — End: 2022-02-13
  Administered 2022-02-12 – 2022-02-13 (×2): 40 mg via ORAL
  Filled 2022-02-11 (×2): qty 1

## 2022-02-11 MED ORDER — KETOROLAC TROMETHAMINE 30 MG/ML IJ SOLN
30.0000 mg | Freq: Four times a day (QID) | INTRAMUSCULAR | Status: DC | PRN
  Administered 2022-02-11 – 2022-02-13 (×5): 30 mg via INTRAVENOUS
  Filled 2022-02-11 (×5): qty 1

## 2022-02-11 MED ORDER — DICYCLOMINE HCL 10 MG PO CAPS
10.0000 mg | ORAL_CAPSULE | Freq: Three times a day (TID) | ORAL | Status: DC
  Administered 2022-02-11 – 2022-02-13 (×6): 10 mg via ORAL
  Filled 2022-02-11 (×6): qty 1

## 2022-02-11 MED ORDER — SODIUM CHLORIDE 0.9 % IV SOLN
250.0000 mL | INTRAVENOUS | Status: DC | PRN
Start: 2022-02-11 — End: 2022-02-13
  Administered 2022-02-11: 250 mL via INTRAVENOUS

## 2022-02-11 MED ORDER — SODIUM CHLORIDE 0.9% FLUSH
3.0000 mL | Freq: Two times a day (BID) | INTRAVENOUS | Status: DC
  Administered 2022-02-11 – 2022-02-12 (×3): 3 mL via INTRAVENOUS

## 2022-02-11 MED ORDER — ACETAMINOPHEN 650 MG RE SUPP: 650.0000 mg | Freq: Four times a day (QID) | RECTAL | Status: DC | PRN

## 2022-02-11 MED ORDER — OXYCODONE HCL 5 MG PO TABS
5.0000 mg | ORAL_TABLET | ORAL | Status: DC | PRN
  Administered 2022-02-11: 5 mg via ORAL
  Filled 2022-02-11: qty 1

## 2022-02-11 MED ORDER — CIPROFLOXACIN IN D5W 400 MG/200ML IV SOLN
400.0000 mg | Freq: Once | INTRAVENOUS | Status: AC
  Administered 2022-02-11: 400 mg via INTRAVENOUS
  Filled 2022-02-11: qty 200

## 2022-02-11 MED ORDER — ENOXAPARIN SODIUM 40 MG/0.4ML IJ SOSY
40.0000 mg | PREFILLED_SYRINGE | INTRAMUSCULAR | Status: DC
  Administered 2022-02-11 – 2022-02-12 (×2): 40 mg via SUBCUTANEOUS
  Filled 2022-02-11 (×2): qty 0.4

## 2022-02-11 NOTE — ED Provider Notes (Cosign Needed)
[facility] Provider Note   CSN: [id number] Arrival date & time: 02/11/22  4315     History  Chief Complaint  Patient presents with   Abdominal Pain    Deborah Humphrey is a 46 y.o. female.   Abdominal Pain Associated symptoms: nausea and vomiting   Associated symptoms: no chest pain, no constipation, no diarrhea, no dysuria, no fever and no shortness of breath        Deborah Humphrey is a 46 y.o. female with past medical history significant for GERD, and IBS currently followed by GI.  she presents to the Emergency Department complaining of waxing and waning abdominal pain for many months.  She has been seen by GI for this and tried multiple medications.  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  Pain has recently been worse in her left lower abdomen.  She has intermittent episodes of nausea and vomiting.  Denies any diarrhea, no melena or hematochezia.  She also denies any fever, chills, or urinary symptoms.  She had a colonoscopy 02/28/2021 without acute findings    Home Medications Prior to Admission medications   Medication Sig Start Date End Date Taking? Authorizing Provider  acetaminophen (TYLENOL) 500 MG tablet Take 1,000 mg by mouth every 6 (six) hours as needed for moderate pain.   Yes [provider]  amitriptyline (ELAVIL) 10 MG tablet Take 10 mg by mouth at bedtime. 01/06/22  Yes [provider]  amoxicillin (AMOXIL) 500 MG capsule Take 500 mg by mouth 2 (two) times daily. 02/06/22 02/16/22 Yes [provider]  clidinium-chlordiazePOXIDE (LIBRAX) 5-2.5 MG capsule Take 1 capsule by mouth 3 (three) times daily before meals. 01/23/22  Yes Montez Morita, Quillian Quince, MD  Lactobacillus-Inulin (CULTURELLE ADULT ULT BALANCE PO) Take by mouth daily.   Yes [provider]  omeprazole (PRILOSEC) 40 MG capsule Take 1 capsule (40 mg total) by mouth daily before breakfast. 03/01/21  Yes Montez Morita, Daniel, MD  ondansetron  (ZOFRAN) 4 MG tablet Take 1 tablet (4 mg total) by mouth every 8 (eight) hours as needed for nausea or vomiting. Patient not taking: Reported on 01/28/2022 04/04/21   Harvel Quale, MD      Allergies    Bee venom    Review of Systems   Review of Systems  Constitutional:  Negative for activity change, appetite change and fever.  Respiratory:  Negative for shortness of breath.   Cardiovascular:  Negative for chest pain.  Gastrointestinal:  Positive for abdominal pain, nausea and vomiting. Negative for blood in stool, constipation and diarrhea.  Genitourinary:  Negative for dysuria and flank pain.  Neurological:  Negative for weakness and numbness.  All other systems reviewed and are negative.  Physical Exam Updated Vital Signs BP 129/86    Pulse 84    Temp 98.5 F (36.9 C) (Oral)    Resp 15    Ht 5' 2.5" (1.588 m)    Wt 95 kg    SpO2 99%    BMI 37.70 kg/m  Physical Exam Vitals and nursing note reviewed.  Constitutional:      Appearance: Normal appearance.     Comments: Patient is uncomfortable appearing  HENT:     Mouth/Throat:     Mouth: Mucous membranes are moist.  Cardiovascular:     Rate and Rhythm: Normal rate and regular rhythm.     Pulses: Normal pulses.  Pulmonary:     Effort: Pulmonary effort is normal. No respiratory distress.  Abdominal:  General: There is no distension.     Palpations: Abdomen is soft. There is no mass.     Tenderness: There is abdominal tenderness.     Comments: Tenderness of the left lower quadrant.  No abdominal guarding, no CVA tenderness  Musculoskeletal:     Right lower leg: No edema.     Left lower leg: No edema.  Skin:    General: Skin is warm.     Capillary Refill: Capillary refill takes less than 2 seconds.  Neurological:     General: No focal deficit present.     Mental Status: She is alert.     Sensory: No sensory deficit.     Motor: No weakness.    ED Results / Procedures / Treatments   Labs (all labs ordered  are listed, but only abnormal results are displayed) Labs Reviewed  COMPREHENSIVE METABOLIC PANEL - Abnormal; Notable for the following components:      Result Value   CO2 19 (*)    Glucose, Bld 123 (*)    Calcium 8.7 (*)    All other components within normal limits  URINALYSIS, ROUTINE W REFLEX MICROSCOPIC - Abnormal; Notable for the following components:   Hgb urine dipstick SMALL (*)    All other components within normal limits  LIPASE, BLOOD  CBC  POC URINE PREG, ED    EKG None  Radiology CT ABDOMEN PELVIS W CONTRAST  Result Date: 02/11/2022 CLINICAL DATA:  Abdominal pain, nausea and vomiting. EXAM: CT ABDOMEN AND PELVIS WITH CONTRAST TECHNIQUE: Multidetector CT imaging of the abdomen and pelvis was performed using the standard protocol following bolus administration of intravenous contrast. RADIATION DOSE REDUCTION: This exam was performed according to the departmental dose-optimization program which includes automated exposure control, adjustment of the mA and/or kV according to patient size and/or use of iterative reconstruction technique. CONTRAST:  161m OMNIPAQUE IOHEXOL 300 MG/ML  SOLN COMPARISON:  01/01/2021. FINDINGS: Lower chest: Lung bases are clear. Heart size normal. No pericardial or pleural effusion. Distal esophagus is unremarkable. Hepatobiliary: Liver is enlarged, 20.9 cm. Liver and gallbladder are otherwise unremarkable. No biliary ductal dilatation. Pancreas: Negative. Spleen: Negative. Adrenals/Urinary Tract: Adrenal glands and kidneys are unremarkable. Ureters are decompressed. Bladder is grossly unremarkable. Stomach/Bowel: Stomach, small bowel, appendix and majority of the colon are unremarkable. Pericolonic inflammatory haziness and stranding adjacent to the proximal sigmoid colon. No extraluminal air or organized fluid collection. Remainder of the rectosigmoid colon is unremarkable. Vascular/Lymphatic: Vascular structures are unremarkable. No pathologically  enlarged lymph nodes. Reproductive: Septate or bicornuate uterus. 1.9 cm hypodense lesion in the uterus may represent a fibroid. No adnexal mass. Other: Small pelvic free fluid. There may be minimal associated peritoneal hyperattenuation on the right. A rounded collection of fluid adjacent to the uterine fundus measures 3.1 x 5.8 cm (2/62). No associated peripheral hyperattenuation. 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 IMPRESSION: 1. Sigmoid diverticulitis with associated small ascites and possible early peritonitis. Loculated fluid adjacent to the uterine fundus. Difficult to exclude an impending abscess. 2. Hepatomegaly. Electronically Signed   By: MLorin PicketM.D.   On: 02/11/2022 10:36    Procedures Procedures    Medications Ordered in ED Medications  ciprofloxacin (CIPRO) IVPB 400 mg (has no administration in time range)  metroNIDAZOLE (FLAGYL) IVPB 500 mg (has no administration in time range)  morphine (PF) 2 MG/ML injection 2 mg (has no administration in time range)  sodium chloride 0.9 %  bolus 1,000 mL (0 mLs Intravenous Stopped 02/11/22 1043)  ondansetron (ZOFRAN) injection 4 mg (4 mg Intravenous Given 02/11/22 0944)  morphine (PF) 4 MG/ML injection 4 mg (4 mg Intravenous Given 02/11/22 0945)  iohexol (OMNIPAQUE) 300 MG/ML solution 100 mL (100 mLs Intravenous Contrast Given 02/11/22 1017)    ED Course/ Medical Decision Making/ A&P                           Medical Decision Making Patient here with longstanding history of abdominal pain.  Describes left lower quadrant pain for several days.  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  She sees GI and has been tried on multiple medications without improvement of her symptoms.  Endorses some nausea with intermittent vomiting.  On my exam, patient has left lower quadrant tenderness, no CVA tenderness and she denies any urinary symptoms.  Differential  would include ovarian cyst, torsion, acute diverticulitis we will proceed with labs and CT imaging of the abdomen and pelvis.  Amount and/or Complexity of Data Reviewed External Data Reviewed:     Details: Prior medical records reviewed by me.  Last CT abdomen 12/2020.  Patient had colonoscopy 02/28/2021 without evidence of diverticulosis or diverticulitis. Labs: ordered.    Details: Labs interpreted by me, no evidence of leukocytosis.  Lipase unremarkable, chemistry show blood sugar of 123 kidney function unremarkable.  Urine pregnancy test negative, COVID and influenza testing negative urinalysis without evidence of infection. Radiology: ordered.    Details: CT abdomen and pelvis shows sigmoid diverticulitis with possible early peritonitis and a loculated fluid collection adjacent to the uterine fundus that could represent an pending abscess.  I have spoken with radiologist, Dr. Rosario Jacks regarding ultrasound for further clarification of this area, she did not feel ultrasound would be beneficial at this time. Discussion of management or test interpretation with external provider(s): Patient will be started on Cipro and Flagyl.  She will need hospital admission given possible pending abscess and possible early peritonitis.  Discussed findings with [facility] hospitalist, Dr. Manuella Ghazi who agrees to admit.  Risk Prescription drug management. Decision regarding hospitalization.           Final Clinical Impression(s) / ED Diagnoses Final diagnoses:  Acute diverticulitis    Rx / DC Orders ED Discharge Orders     None         Kem Parkinson, PA-C 02/11/22 1831

## 2022-02-11 NOTE — Assessment & Plan Note (Signed)
Appreciate general surgery review of CT scan ?Continue on ciprofloxacin and Flagyl as prescribed in ED ?Continue to monitor lab work along with signs and symptoms ?Gentle IV fluid ?

## 2022-02-11 NOTE — H&P (Signed)
?History and Physical  ? ? ?Patient: Deborah Humphrey JGG:[id number] DOB: 1976/04/22 ?DOA: 02/11/2022 ?DOS: the patient was seen and examined on 02/11/2022 ?PCP: [facility]  ?Patient coming from: Home ? ?Chief Complaint:  ?Chief Complaint  ?Patient presents with  ? Abdominal Pain  ? ?HPI: Deborah Humphrey is a 46 y.o. female with medical history significant of IBS and GERD who presented with worsening left lower quadrant pain as well as some nausea and vomiting.  She states that she has recurrent episodes of abdominal pain with nausea and vomiting as well as alternating episodes of diarrhea and constipation that occur on a monthly basis.  However, she claims that this episode has been much more severe than during the previous occasions.  She states that last month she had a similar episode that lasted approximately 3 weeks and then spontaneously resolved.  She follows with her gastroenterologist Dr. Cherlynn June who has been working on a medication regimen to assist with her IBS exacerbations.  She denies any bloody bowel movements, fevers, chills, or sick contacts.  She states that she has otherwise been taking her medications as prescribed. ? ?Review of Systems: As mentioned in the history of present illness. All other systems reviewed and are negative. ?Past Medical History:  ?Diagnosis Date  ? Anxiety   ? Cervical spine fracture (HCC)   ? Denies havign surgery   ? gestational hypertention   ? gestational hypertension  ? Helicobacter pylori (H. pylori)   ? IBS (irritable bowel syndrome)   ? Infertility, female   ? ?Past Surgical History:  ?Procedure Laterality Date  ? BIOPSY  02/28/2021  ? Procedure: BIOPSY;  Surgeon: Harvel Quale, MD;  Location: [facility];  Service: Gastroenterology;;  ? CESAREAN SECTION    ? COLONOSCOPY WITH PROPOFOL N/A 02/28/2021  ? Procedure: COLONOSCOPY WITH PROPOFOL;  Surgeon: Harvel Quale, MD;  Location: [facility];  Service: Gastroenterology;  Laterality:  N/A;  AM  ? DILATATION AND CURETTAGE/HYSTEROSCOPY WITH MINERVA N/A 08/22/2020  ? Procedure: DILATATION AND CURETTAGE/HYSTEROSCOPY WITH MINERVA;  Surgeon: Jonnie Kind, MD;  Location: [facility];  Service: Gynecology;  Laterality: N/A;  ? DILATION AND CURETTAGE OF UTERUS    ? x3  ? ESOPHAGOGASTRODUODENOSCOPY (EGD) WITH PROPOFOL N/A 02/28/2021  ? Procedure: ESOPHAGOGASTRODUODENOSCOPY (EGD) WITH PROPOFOL;  Surgeon: Harvel Quale, MD;  Location: [facility];  Service: Gastroenterology;  Laterality: N/A;  ? HALO APPLICATION    ? age 46  ? LAPAROSCOPIC BILATERAL SALPINGECTOMY Bilateral 08/22/2020  ? Procedure: LAPAROSCOPIC BILATERAL SALPINGECTOMY;  Surgeon: Jonnie Kind, MD;  Location: [facility];  Service: Gynecology;  Laterality: Bilateral;  ? ?Social History:  reports that she quit smoking about 11 years ago. Her smoking use included cigarettes. She has a 15.00 pack-year smoking history. She has never used smokeless tobacco. She reports current alcohol use. She reports that she does not use drugs. ? ?Allergies  ?Allergen Reactions  ? Bee Venom Swelling  ? ? ?Family History  ?Problem Relation Age of Onset  ? Cancer Paternal Grandfather   ?     pancreatic?  ? Cancer Paternal Grandmother   ?     liver  ? Dementia Maternal Grandmother   ? Cancer Father   ?     pancreatic  ? Cancer Mother   ?     skin  ? Breast cancer Sister   ? Polycystic ovary syndrome Sister   ? Endometriosis Sister   ? Breast cancer Sister   ? Breast cancer  Paternal Aunt   ? ? ?Prior to Admission medications   ?Medication Sig Start Date End Date Taking? Authorizing Provider  ?acetaminophen (TYLENOL) 500 MG tablet Take 1,000 mg by mouth every 6 (six) hours as needed for moderate pain.   Yes [provider]  ?amitriptyline (ELAVIL) 10 MG tablet Take 10 mg by mouth at bedtime. 01/06/22  Yes [provider]  ?amoxicillin (AMOXIL) 500 MG capsule Take 500 mg by mouth 2 (two) times daily. 02/06/22 02/16/22 Yes [provider]   ?clidinium-chlordiazePOXIDE (LIBRAX) 5-2.5 MG capsule Take 1 capsule by mouth 3 (three) times daily before meals. 01/23/22  Yes Harvel Quale, MD  ?Lactobacillus-Inulin (CULTURELLE ADULT ULT BALANCE PO) Take by mouth daily.   Yes [provider]  ?omeprazole (PRILOSEC) 40 MG capsule Take 1 capsule (40 mg total) by mouth daily before breakfast. 03/01/21  Yes Montez Morita, Quillian Quince, MD  ?ondansetron (ZOFRAN) 4 MG tablet Take 1 tablet (4 mg total) by mouth every 8 (eight) hours as needed for nausea or vomiting. ?Patient not taking: Reported on 01/28/2022 04/04/21   Harvel Quale, MD  ? ? ?Physical Exam: ?Vitals:  ? 02/11/22 0900 02/11/22 0930 02/11/22 1003 02/11/22 1100  ?BP: 127/83 130/90 126/90 129/86  ?Pulse: 89 84 85 84  ?Resp: '18  16 15  '$ ?Temp:      ?TempSrc:      ?SpO2: 97% 97% 100% 99%  ?Weight:      ?Height:      ? ?General exam: Appears calm and comfortable  ?Respiratory system: Clear to auscultation. Respiratory effort normal.  ?Cardiovascular system: S1 & S2 heard, RRR.  ?Gastrointestinal system: Abdomen is soft, no guarding, rigidity, or rebound.  Tenderness to gentle palpation over left lower quadrant noted. ?Central nervous system: Alert and awake ?Extremities: No edema ?Skin: No significant lesions noted ?Psychiatry: Flat affect. ? ?Data Reviewed: ? ?There are no new results to review at this time. ? ?Assessment and Plan: ?* Diverticulitis ?Appreciate general surgery review of CT scan ?Continue on ciprofloxacin and Flagyl as prescribed in ED ?Continue to monitor lab work along with signs and symptoms ?Gentle IV fluid ? ?IBS (irritable bowel syndrome) ?Continue prior prescribed medications ?Pain management ? ?GERD (gastroesophageal reflux disease) ?Continue PPI ? ? ? Advance Care Planning: Full code ? ?Consults: None ? ?Family Communication: None at bedside ? ?Severity of Illness: ?The appropriate patient status for this patient is OBSERVATION. Observation status is judged  to be reasonable and necessary in order to provide the required intensity of service to ensure the patient's safety. The patient's presenting symptoms, physical exam findings, and initial radiographic and laboratory data in the context of their medical condition is felt to place them at decreased risk for further clinical deterioration. Furthermore, it is anticipated that the patient will be medically stable for discharge from the hospital within 2 midnights of admission.  ? ?Author: ?Rodena Goldmann, DO ?02/11/2022 1:13 PM ? ?For on call review [email or web address].  ?

## 2022-02-11 NOTE — Assessment & Plan Note (Signed)
Continue PPI ?

## 2022-02-11 NOTE — Assessment & Plan Note (Signed)
Continue prior prescribed medications ?Pain management ?

## 2022-02-11 NOTE — ED Triage Notes (Signed)
Pt reports severe abd pain for over a year. Hx of GERD, IBS, anxiety. Pain worse on LLQ. Endorses n/v when pain is severe.  ?

## 2022-02-11 NOTE — ED Notes (Signed)
Provider at bedside

## 2022-02-11 NOTE — ED Notes (Signed)
Patient transported to CT 

## 2022-02-12 ENCOUNTER — Observation Stay (HOSPITAL_COMMUNITY): Payer: 59

## 2022-02-12 DIAGNOSIS — R109 Unspecified abdominal pain: Secondary | ICD-10-CM | POA: Diagnosis not present

## 2022-02-12 DIAGNOSIS — N809 Endometriosis, unspecified: Secondary | ICD-10-CM | POA: Diagnosis present

## 2022-02-12 DIAGNOSIS — Z87898 Personal history of other specified conditions: Secondary | ICD-10-CM | POA: Diagnosis not present

## 2022-02-12 DIAGNOSIS — K5792 Diverticulitis of intestine, part unspecified, without perforation or abscess without bleeding: Secondary | ICD-10-CM | POA: Diagnosis not present

## 2022-02-12 DIAGNOSIS — F419 Anxiety disorder, unspecified: Secondary | ICD-10-CM | POA: Diagnosis present

## 2022-02-12 DIAGNOSIS — K529 Noninfective gastroenteritis and colitis, unspecified: Secondary | ICD-10-CM | POA: Diagnosis present

## 2022-02-12 DIAGNOSIS — R1310 Dysphagia, unspecified: Secondary | ICD-10-CM | POA: Diagnosis not present

## 2022-02-12 DIAGNOSIS — K2270 Barrett's esophagus without dysplasia: Secondary | ICD-10-CM | POA: Diagnosis present

## 2022-02-12 DIAGNOSIS — F429 Obsessive-compulsive disorder, unspecified: Secondary | ICD-10-CM | POA: Diagnosis present

## 2022-02-12 DIAGNOSIS — Z20822 Contact with and (suspected) exposure to covid-19: Secondary | ICD-10-CM | POA: Diagnosis present

## 2022-02-12 DIAGNOSIS — K219 Gastro-esophageal reflux disease without esophagitis: Secondary | ICD-10-CM | POA: Diagnosis present

## 2022-02-12 DIAGNOSIS — K580 Irritable bowel syndrome with diarrhea: Secondary | ICD-10-CM | POA: Diagnosis present

## 2022-02-12 DIAGNOSIS — Z87891 Personal history of nicotine dependence: Secondary | ICD-10-CM | POA: Diagnosis not present

## 2022-02-12 DIAGNOSIS — R933 Abnormal findings on diagnostic imaging of other parts of digestive tract: Secondary | ICD-10-CM

## 2022-02-12 DIAGNOSIS — Z803 Family history of malignant neoplasm of breast: Secondary | ICD-10-CM | POA: Diagnosis not present

## 2022-02-12 DIAGNOSIS — R188 Other ascites: Secondary | ICD-10-CM | POA: Diagnosis not present

## 2022-02-12 DIAGNOSIS — N9489 Other specified conditions associated with female genital organs and menstrual cycle: Secondary | ICD-10-CM | POA: Diagnosis present

## 2022-02-12 DIAGNOSIS — Z79899 Other long term (current) drug therapy: Secondary | ICD-10-CM | POA: Diagnosis not present

## 2022-02-12 DIAGNOSIS — Z91030 Bee allergy status: Secondary | ICD-10-CM | POA: Diagnosis not present

## 2022-02-12 DIAGNOSIS — K582 Mixed irritable bowel syndrome: Secondary | ICD-10-CM | POA: Diagnosis not present

## 2022-02-12 LAB — COMPREHENSIVE METABOLIC PANEL
ALT: 15 U/L (ref 0–44)
AST: 14 U/L — ABNORMAL LOW (ref 15–41)
Albumin: 3.3 g/dL — ABNORMAL LOW (ref 3.5–5.0)
Alkaline Phosphatase: 63 U/L (ref 38–126)
Anion gap: 7 (ref 5–15)
BUN: 9 mg/dL (ref 6–20)
CO2: 25 mmol/L (ref 22–32)
Calcium: 8.4 mg/dL — ABNORMAL LOW (ref 8.9–10.3)
Chloride: 106 mmol/L (ref 98–111)
Creatinine, Ser: 0.92 mg/dL (ref 0.44–1.00)
GFR, Estimated: >60 mL/min (ref >60)
Glucose, Bld: 90 mg/dL (ref 70–99)
Potassium: 3.8 mmol/L (ref 3.5–5.1)
Sodium: 138 mmol/L (ref 135–145)
Total Bilirubin: 0.5 mg/dL (ref 0.3–1.2)
Total Protein: 6 g/dL — ABNORMAL LOW (ref 6.5–8.1)

## 2022-02-12 LAB — CBC
HCT: 39.8 % (ref 36.0–46.0)
Hemoglobin: 12.8 g/dL (ref 12.0–15.0)
MCH: 29.2 pg (ref 26.0–34.0)
MCHC: 32.2 g/dL (ref 30.0–36.0)
MCV: 90.9 fL (ref 80.0–100.0)
Platelets: 156 10*3/uL (ref 150–400)
RBC: 4.38 MIL/uL (ref 3.87–5.11)
RDW: 12.7 % (ref 11.5–15.5)
WBC: 4.5 10*3/uL (ref 4.0–10.5)
nRBC: 0 % (ref 0.0–0.2)

## 2022-02-12 LAB — MAGNESIUM: Magnesium: 2 mg/dL (ref 1.7–2.4)

## 2022-02-12 LAB — HIV ANTIBODY (ROUTINE TESTING W REFLEX): HIV Screen 4th Generation wRfx: NONREACTIVE

## 2022-02-12 MED ORDER — METRONIDAZOLE 500 MG/100ML IV SOLN
500.0000 mg | Freq: Three times a day (TID) | INTRAVENOUS | Status: DC
  Administered 2022-02-12 – 2022-02-13 (×3): 500 mg via INTRAVENOUS
  Filled 2022-02-12 (×4): qty 100

## 2022-02-12 NOTE — Progress Notes (Signed)
?PROGRESS NOTE ? ? ? ?Deborah Humphrey  VHQ:[id number] DOB: 1976-07-15 DOA: 02/11/2022 ?PCP: [facility] ? ? ?Brief Narrative:  ?Per HPI: ?Deborah Humphrey is a 46 y.o. female with medical history significant of IBS and GERD who presented with worsening left lower quadrant pain as well as some nausea and vomiting.  She states that she has recurrent episodes of abdominal pain with nausea and vomiting as well as alternating episodes of diarrhea and constipation that occur on a monthly basis.  However, she claims that this episode has been much more severe than during the previous occasions.  She states that last month she had a similar episode that lasted approximately 3 weeks and then spontaneously resolved.  She follows with her gastroenterologist Dr. Cherlynn June who has been working on a medication regimen to assist with her IBS exacerbations.  She denies any bloody bowel movements, fevers, chills, or sick contacts.  She states that she has otherwise been taking her medications as prescribed. ? ?02/12/22: Pt was admitted for colitis and noted fluid in pelvic cavity. GI recommending continuation of IV antibiotics and Pelvic US for further evaluation today.  ? ? ?Assessment & Plan: ?  ?Principal Problem: ?  Diverticulitis ?Active Problems: ?  Dysphagia ?  GERD (gastroesophageal reflux disease) ?  IBS (irritable bowel syndrome) ?  Abnormal CT scan, colon ? ?Assessment and Plan: ?* Diverticulitis ?Appreciate general surgery review of CT scan ?Continue on ciprofloxacin and Flagyl as prescribed in ED ?Continue to monitor lab work along with signs and symptoms ?Gentle IV fluid ? ?IBS (irritable bowel syndrome) ?Continue prior prescribed medications ?Pain management ? ?GERD (gastroesophageal reflux disease) ?Continue PPI ? ? ?DVT prophylaxis:Lovenox ?Code Status: Full ?Family Communication: None at bedside ?Disposition Plan:  ?Status is: Observation ?The patient will require care spanning > 2 midnights and should be moved  to inpatient because: IV antibiotics. ? ?Consultants:  ?GI ? ?Procedures:  ?See below ? ?Antimicrobials:  ?Anti-infectives (From admission, onward)  ? ? Start     Dose/Rate Route Frequency Ordered Stop  ? 02/12/22 1115  metroNIDAZOLE (FLAGYL) IVPB 500 mg       ? 500 mg ?100 mL/hr over 60 Minutes Intravenous Every 8 hours 02/12/22 1107    ? 02/11/22 2200  ciprofloxacin (CIPRO) IVPB 400 mg       ? 400 mg ?200 mL/hr over 60 Minutes Intravenous Every 12 hours 02/11/22 1317    ? 02/11/22 1200  ciprofloxacin (CIPRO) IVPB 400 mg       ? 400 mg ?200 mL/hr over 60 Minutes Intravenous  Once 02/11/22 1153 02/11/22 1305  ? 02/11/22 1200  metroNIDAZOLE (FLAGYL) IVPB 500 mg  Status:  Discontinued       ? 500 mg ?100 mL/hr over 60 Minutes Intravenous Every 12 hours 02/11/22 1153 02/12/22 1107  ? ?  ? ? ?Subjective: ?Patient seen and evaluated today with ongoing abdominal pain noted today. She has not noticed much improvement. ? ?Objective: ?Vitals:  ? 02/11/22 2130 02/12/22 0130 02/12/22 0617 02/12/22 1409  ?BP: 117/83 112/75 101/70 118/73  ?Pulse: 89 78 71 74  ?Resp: '20 18 19   '$ ?Temp: 98.4 ?F (36.9 ?C) 98.5 ?F (36.9 ?C) 98.1 ?F (36.7 ?C)   ?TempSrc: Oral Oral Oral   ?SpO2: 99% 96% 94% 98%  ?Weight:      ?Height:      ? ? ?Intake/Output Summary (Last 24 hours) at 02/12/2022 1703 ?Last data filed at 02/12/2022 1000 ?Gross per 24 hour  ?Intake 586.44 ml  ?  Output --  ?Net 586.44 ml  ? ?Filed Weights  ? 02/11/22 5397 02/11/22 1323  ?Weight: 95 kg 96.1 kg  ? ? ?Examination: ? ?General exam: Appears calm and comfortable  ?Respiratory system: Clear to auscultation. Respiratory effort normal. ?Cardiovascular system: S1 & S2 heard, RRR.  ?Gastrointestinal system: Abdomen is soft ?Central nervous system: Alert and awake ?Extremities: No edema ?Skin: No significant lesions noted ?Psychiatry: Flat affect. ? ? ? ?Data Reviewed: I have personally reviewed following labs and imaging studies ? ?CBC: ?Recent Labs  ?Lab 02/11/22 ?6734 02/12/22 ?0440   ?WBC 7.2 4.5  ?HGB 14.8 12.8  ?HCT 43.1 39.8  ?MCV 89.2 90.9  ?PLT 187 156  ? ?Basic Metabolic Panel: ?Recent Labs  ?Lab 02/11/22 ?1937 02/12/22 ?0440  ?NA 135 138  ?K 3.6 3.8  ?CL 106 106  ?CO2 19* 25  ?GLUCOSE 123* 90  ?BUN 10 9  ?CREATININE 0.89 0.92  ?CALCIUM 8.7* 8.4*  ?MG  --  2.0  ? ?GFR: ?Estimated Creatinine Clearance: 86.1 mL/min (by C-G formula based on SCr of 0.92 mg/dL). ?Liver Function Tests: ?Recent Labs  ?Lab 02/11/22 ?9024 02/12/22 ?0440  ?AST 24 14*  ?ALT 19 15  ?ALKPHOS 80 63  ?BILITOT 0.6 0.5  ?PROT 7.1 6.0*  ?ALBUMIN 4.0 3.3*  ? ?Recent Labs  ?Lab 02/11/22 ?0844  ?LIPASE 32  ? ?No results for input(s): AMMONIA in the last 168 hours. ?Coagulation Profile: ?No results for input(s): INR, PROTIME in the last 168 hours. ?Cardiac Enzymes: ?No results for input(s): CKTOTAL, CKMB, CKMBINDEX, TROPONINI in the last 168 hours. ?BNP (last 3 results) ?No results for input(s): PROBNP in the last 8760 hours. ?HbA1C: ?No results for input(s): HGBA1C in the last 72 hours. ?CBG: ?No results for input(s): GLUCAP in the last 168 hours. ?Lipid Profile: ?No results for input(s): CHOL, HDL, LDLCALC, TRIG, CHOLHDL, LDLDIRECT in the last 72 hours. ?Thyroid Function Tests: ?No results for input(s): TSH, T4TOTAL, FREET4, T3FREE, THYROIDAB in the last 72 hours. ?Anemia Panel: ?No results for input(s): VITAMINB12, FOLATE, FERRITIN, TIBC, IRON, RETICCTPCT in the last 72 hours. ?Sepsis Labs: ?No results for input(s): PROCALCITON, LATICACIDVEN in the last 168 hours. ? ?Recent Results (from the past 240 hour(s))  ?Resp Panel by RT-PCR (Flu A&B, Covid) Nasopharyngeal Swab     Status: None  ? Collection Time: 02/11/22 12:08 PM  ? Specimen: Nasopharyngeal Swab; Nasopharyngeal(NP) swabs in vial transport medium  ?Result Value Ref Range Status  ? SARS Coronavirus 2 by RT PCR NEGATIVE NEGATIVE Final  ?  Comment: (NOTE) ?SARS-CoV-2 target nucleic acids are NOT DETECTED. ? ?The SARS-CoV-2 RNA is generally detectable in upper  respiratory ?specimens during the acute phase of infection. The lowest ?concentration of SARS-CoV-2 viral copies this assay can detect is ?138 copies/mL. A negative result does not preclude SARS-Cov-2 ?infection and should not be used as the sole basis for treatment or ?other patient management decisions. A negative result may occur with  ?improper specimen collection/handling, submission of specimen other ?than nasopharyngeal swab, presence of viral mutation(s) within the ?areas targeted by this assay, and inadequate number of viral ?copies(<138 copies/mL). A negative result must be combined with ?clinical observations, patient history, and epidemiological ?information. The expected result is Negative. ? ?Fact Sheet for Patients:  ?EntrepreneurPulse.com.au ? ?Fact Sheet for Healthcare Providers:  ?IncredibleEmployment.be ? ?This test is no t yet approved or cleared by the [place] FDA and  ?has been authorized for detection and/or diagnosis of SARS-CoV-2 by ?FDA under an Emergency Use Authorization (  EUA). This EUA will remain  ?in effect (meaning this test can be used) for the duration of the ?COVID-19 declaration under Section 564(b)(1) of the Act, 21 ?U.S.C.section 360bbb-3(b)(1), unless the authorization is terminated  ?or revoked sooner.  ? ? ?  ? Influenza A by PCR NEGATIVE NEGATIVE Final  ? Influenza B by PCR NEGATIVE NEGATIVE Final  ?  Comment: (NOTE) ?The Xpert Xpress SARS-CoV-2/FLU/RSV plus assay is intended as an aid ?in the diagnosis of influenza from Nasopharyngeal swab specimens and ?should not be used as a sole basis for treatment. Nasal washings and ?aspirates are unacceptable for Xpert Xpress SARS-CoV-2/FLU/RSV ?testing. ? ?Fact Sheet for Patients: ?EntrepreneurPulse.com.au ? ?Fact Sheet for Healthcare Providers: ?IncredibleEmployment.be ? ?This test is not yet approved or cleared by the [place] FDA and ?has been  authorized for detection and/or diagnosis of SARS-CoV-2 by ?FDA under an Emergency Use Authorization (EUA). This EUA will remain ?in effect (meaning this test can be used) for the duration of the ?COVID-19 declarati

## 2022-02-12 NOTE — Hospital Course (Signed)
Per HPI: ?Deborah Humphrey is a 46 y.o. female with medical history significant of IBS and GERD who presented with worsening left lower quadrant pain as well as some nausea and vomiting.  She states that she has recurrent episodes of abdominal pain with nausea and vomiting as well as alternating episodes of diarrhea and constipation that occur on a monthly basis.  However, she claims that this episode has been much more severe than during the previous occasions.  She states that last month she had a similar episode that lasted approximately 3 weeks and then spontaneously resolved.  She follows with her gastroenterologist Dr. Cherlynn June who has been working on a medication regimen to assist with her IBS exacerbations.  She denies any bloody bowel movements, fevers, chills, or sick contacts.  She states that she has otherwise been taking her medications as prescribed. ? ?02/12/22: Pt was admitted for colitis and noted fluid in pelvic cavity. GI recommending continuation of IV antibiotics and Pelvic US for further evaluation today. ?

## 2022-02-12 NOTE — Consult Note (Signed)
$'@LOGO'P$ @ ? ? ?Referring Provider: Dr. Manuella Ghazi ?Primary Care Physician:  [facility] ?Primary Gastroenterologist:  Dr. Jenetta Downer ? ?Date of Admission: 02/11/22 ?Date of Consultation: 02/12/22 ? ?Reason for Consultation:  Colitis vs diverticulitis, chronic history of IBS ? ?HPI:  ?Deborah Humphrey is a 46 y.o. year old female with history of GERD, Barrett's esophagus, and IBS, anxiety, OCD, who presented to the emergency room due to worsening left lower quadrant abdominal pain with nausea and vomiting. ? ?ED course: ?Hemodynamically stable. ?CBC within normal limits.  CMP with glucose of 123, calcium 8.7, otherwise no significant abnormalities.  Lipase normal.  Urine pregnancy test negative.  Respiratory panel negative. ?CT A/P with contrast with pericolonic inflammatory haziness and stranding adjacent to the proximal sigmoid colon diagnosis sigmoid diverticulitis with associated small ascites and possible early peritonitis.  Loculated fluid adjacent to the uterine fundus, difficult to exclude impending abscess (3.1 x 5.8 cm ).  ? ?Consult:  ?Chronically, about once a month with symptoms lasting for about a week, she has episodes of lower abdominal pain with alternating constipation and diarrhea. Abdominal pain starts out as cramping and gets more severe. States the pain is excruciating and she cannot function. Will have diarrhea for a couple of days that is followed by constipation and bloating. Constipation is more of trouble evacuating, but with mushy stools. When she urinates during these episodes, she also has abdominal pain shooting from vaginal area to stomach.  Denies burning with urination. ? ?This most recent episode started 2 days ago.  Initially, felt she was having.  Tight cramps in her lower abdomen, but pain worsened.  States really, her symptoms are exactly the same that they have been previously, but she was just tired of dealing with the recurrent pain, so she came to the emergency room for  evaluation.  She had 4-5 watery bowel movements for the first day, but has not had a bowel movement since she came to the emergency room yesterday morning. Had a little nausea and episode of vomiting the day before she came to the ED. No hematemesis or coffee ground emesis. Occasional nausea/vomiting with these episodes if pain is severe or if she is really upset.  ? ?Pain medications here are helping with the excruciating pain, but still with tenderness.  ? ?At baseline, bowels are constantly changing. 1-2 Bms a day. Stools are often mushy. No hard stools, but at times she has to strain because she has trouble evacuating.  Reports she has caused herself to have hemorrhoids. With acute episodes, she can have several Bms daily, watery, but also may skip a day between bowel movements. No nocturnal stools. No brbpr or melena. No improvement in abdominal pain with a BM.  ? ?Reports she has never tried fiber on a daily basis. Doesn't take stool softeners or antidiarrheals.  ? ?Taking Librax TID.  ?Started Elavil in February.  ?These medications haven't made a difference in her chronic symptoms.  ? ?She was on amoxicillin on 3/8 for possible strep though test was negative. No current symptoms.  ? ?Chronic history of GERD. Taking omeprazole 40 mg daily. Breakthrough once a week if that. Couple of episodes of dysphagia over the last couple of months. This occurred with solid foods. History of dysphagia that had resolved for a while previously. No prior dilation.  ? ? ?Last EGD: 02/28/2021, 3 tongues of salmon-colored mucosa suggestive of Barrett's esophagus extending for 2 cm, confirmed with biopsies.  Normal stomach and small bowel. With normal biopsies of  the small bowel.   ? ?Last Colonoscopy: 02/28/2021, normal colon with neg biopsies for microscopic colitis. ? ?Past Medical History:  ?Diagnosis Date  ? Anxiety   ? Cervical spine fracture (HCC)   ? Denies havign surgery   ? gestational hypertention   ? gestational  hypertension  ? Helicobacter pylori (H. pylori)   ? IBS (irritable bowel syndrome)   ? Infertility, female   ? ? ?Past Surgical History:  ?Procedure Laterality Date  ? BIOPSY  02/28/2021  ? Procedure: BIOPSY;  Surgeon: Harvel Quale, MD;  [facility];  Service: Gastroenterology;;  ? CESAREAN SECTION    ? COLONOSCOPY WITH PROPOFOL N/A 02/28/2021  ? Procedure: COLONOSCOPY WITH PROPOFOL;  Surgeon: Harvel Quale, MD;  [facility];  Service: Gastroenterology;  Laterality: N/A;  AM  ? DILATATION AND CURETTAGE/HYSTEROSCOPY WITH MINERVA N/A 08/22/2020  ? Procedure: DILATATION AND CURETTAGE/HYSTEROSCOPY WITH MINERVA;  Surgeon: Jonnie Kind, MD;  Location: [facility];  Service: Gynecology;  Laterality: N/A;  ? DILATION AND CURETTAGE OF UTERUS    ? x3  ? ESOPHAGOGASTRODUODENOSCOPY (EGD) WITH PROPOFOL N/A 02/28/2021  ? Procedure: ESOPHAGOGASTRODUODENOSCOPY (EGD) WITH PROPOFOL;  Surgeon: Harvel Quale, MD;  [facility];  Service: Gastroenterology;  Laterality: N/A;  ? HALO APPLICATION    ? age 77  ? LAPAROSCOPIC BILATERAL SALPINGECTOMY Bilateral 08/22/2020  ? Procedure: LAPAROSCOPIC BILATERAL SALPINGECTOMY;  Surgeon: Jonnie Kind, MD;  Location: [facility];  Service: Gynecology;  Laterality: Bilateral;  ? ? ?Prior to Admission medications   ?Medication Sig Start Date End Date Taking? Authorizing Provider  ?acetaminophen (TYLENOL) 500 MG tablet Take 1,000 mg by mouth every 6 (six) hours as needed for moderate pain.   Yes [provider]  ?amitriptyline (ELAVIL) 10 MG tablet Take 10 mg by mouth at bedtime. 01/06/22  Yes [provider]  ?amoxicillin (AMOXIL) 500 MG capsule Take 500 mg by mouth 2 (two) times daily. 02/06/22 02/16/22 Yes [provider]  ?clidinium-chlordiazePOXIDE (LIBRAX) 5-2.5 MG capsule Take 1 capsule by mouth 3 (three) times daily before meals. 01/23/22  Yes Harvel Quale, MD  ?Lactobacillus-Inulin (CULTURELLE  ADULT ULT BALANCE PO) Take by mouth daily.   Yes [provider]  ?omeprazole (PRILOSEC) 40 MG capsule Take 1 capsule (40 mg total) by mouth daily before breakfast. 03/01/21  Yes Montez Morita, Quillian Quince, MD  ?ondansetron (ZOFRAN) 4 MG tablet Take 1 tablet (4 mg total) by mouth every 8 (eight) hours as needed for nausea or vomiting. ?Patient not taking: Reported on 01/28/2022 04/04/21   Harvel Quale, MD  ? ? ?Current Facility-Administered Medications  ?Medication Dose Route Frequency Provider Last Rate Last Admin  ? 0.9 %  sodium chloride infusion  250 mL Intravenous PRN Manuella Ghazi, Pratik D, DO   Stopped at 02/12/22 3016  ? acetaminophen (TYLENOL) tablet 650 mg  650 mg Oral Q6H PRN Heath Lark D, DO      ? Or  ? acetaminophen (TYLENOL) suppository 650 mg  650 mg Rectal Q6H PRN Manuella Ghazi, Pratik D, DO      ? acidophilus (RISAQUAD) capsule 1 capsule  1 capsule Oral Daily Manuella Ghazi, Pratik D, DO   1 capsule at 02/12/22 0109  ? amitriptyline (ELAVIL) tablet 10 mg  10 mg Oral QHS Shah, Pratik D, DO   10 mg at 02/11/22 2042  ? ciprofloxacin (CIPRO) IVPB 400 mg  400 mg Intravenous Q12H Shah, Pratik D, DO 200 mL/hr at 02/12/22 0827 400 mg at 02/12/22 0827  ?  dicyclomine (BENTYL) capsule 10 mg  10 mg Oral TID AC Shah, Pratik D, DO   10 mg at 02/12/22 0902  ? enoxaparin (LOVENOX) injection 40 mg  40 mg Subcutaneous Q24H Manuella Ghazi, Pratik D, DO   40 mg at 02/11/22 2043  ? ketorolac (TORADOL) 30 MG/ML injection 30 mg  30 mg Intravenous Q6H PRN Manuella Ghazi, Pratik D, DO   30 mg at 02/12/22 0509  ? metroNIDAZOLE (FLAGYL) IVPB 500 mg  500 mg Intravenous Q12H Shah, Pratik D, DO   Stopped at 02/12/22 0902  ? ondansetron (ZOFRAN) tablet 4 mg  4 mg Oral Q6H PRN Heath Lark D, DO      ? Or  ? ondansetron (ZOFRAN) injection 4 mg  4 mg Intravenous Q6H PRN Manuella Ghazi, Pratik D, DO      ? oxyCODONE (Oxy IR/ROXICODONE) immediate release tablet 5 mg  5 mg Oral Q4H PRN Manuella Ghazi, Pratik D, DO   5 mg at 02/11/22 2046  ? pantoprazole (PROTONIX) EC tablet 40 mg   40 mg Oral Daily Manuella Ghazi, Pratik D, DO   40 mg at 02/12/22 3532  ? sodium chloride flush (NS) 0.9 % injection 3 mL  3 mL Intravenous Q12H Shah, Pratik D, DO   3 mL at 02/12/22 9924  ? sodium chloride flush (NS) 0.9 %

## 2022-02-13 ENCOUNTER — Telehealth (INDEPENDENT_AMBULATORY_CARE_PROVIDER_SITE_OTHER): Payer: Self-pay | Admitting: Gastroenterology

## 2022-02-13 ENCOUNTER — Telehealth: Payer: Self-pay | Admitting: Adult Health

## 2022-02-13 DIAGNOSIS — K5792 Diverticulitis of intestine, part unspecified, without perforation or abscess without bleeding: Secondary | ICD-10-CM | POA: Diagnosis not present

## 2022-02-13 DIAGNOSIS — K582 Mixed irritable bowel syndrome: Secondary | ICD-10-CM

## 2022-02-13 DIAGNOSIS — K219 Gastro-esophageal reflux disease without esophagitis: Secondary | ICD-10-CM

## 2022-02-13 DIAGNOSIS — R188 Other ascites: Secondary | ICD-10-CM

## 2022-02-13 MED ORDER — NORETHINDRONE 0.35 MG PO TABS: 1.0000 | ORAL_TABLET | Freq: Every day | ORAL | 2 refills | Status: DC

## 2022-02-13 NOTE — Discharge Summary (Signed)
Physician Discharge Summary  ?[name] DJT:[id number] DOB: 06-21-1976 DOA: 02/11/2022 ? ?PCP: [facility] ? ?Admit date: 02/11/2022 ? ?Discharge date: 02/13/2022 ? ?Admitted From: Home ? ?Disposition:  Home ? ?Recommendations for Outpatient Follow-up:  ?Follow up with PCP in 1-2 weeks ?Follow up with Gynecology outpatient which patient will schedule; patient will require repeat pelvic ultrasound in 6 weeks ?Follow up with GI Dr. Cherlynn June outpatient which patient will schedule ?Continue on Tylenol and ibuprofen as needed for pain control ?Continue on norethindrone 0.35 mg daily as prescribed for hormonal suppression ? ?Home Health: None ? ?Equipment/Devices: None ? ?Discharge Condition:Stable ? ?CODE STATUS: Full ? ?Diet recommendation: Heart Healthy ? ?Brief/Interim Summary: ?Per HPI: ?Deborah Humphrey is a 46 y.o. female with medical history significant of IBS and GERD who presented with worsening left lower quadrant pain as well as some nausea and vomiting.  She states that she has recurrent episodes of abdominal pain with nausea and vomiting as well as alternating episodes of diarrhea and constipation that occur on a monthly basis.  However, she claims that this episode has been much more severe than during the previous occasions.  She states that last month she had a similar episode that lasted approximately 3 weeks and then spontaneously resolved.  She follows with her gastroenterologist Dr. Cherlynn June who has been working on a medication regimen to assist with her IBS exacerbations.  She denies any bloody bowel movements, fevers, chills, or sick contacts.  She states that she has otherwise been taking her medications as prescribed. ?  ?02/12/22: Pt was admitted for colitis and noted fluid in pelvic cavity. GI recommending continuation of IV antibiotics and Pelvic US for further evaluation today.  ? ?02/13/2022: Patient continues to have some mild pain, pelvic ultrasound reviewed with findings of  cyst-likely hemorrhagic versus findings of early endometriosis.  Discussed case with gynecology, Dr. Damita Dunnings with recommendation for norethindrone to be initiated as well as continue pain management with Tylenol and/or ibuprofen.  She will have close follow-up with gynecology scheduled outpatient to review pelvic ultrasound in 6 weeks and will also follow-up with GI outpatient regarding her IBS. ? ?Discharge Diagnoses:  ?Principal Problem: ?  Diverticulitis ?Active Problems: ?  Dysphagia ?  GERD (gastroesophageal reflux disease) ?  IBS (irritable bowel syndrome) ?  Abnormal CT scan, colon ?  Colitis ? ?Principal discharge diagnosis: Abdominal pain secondary to findings of hemorrhagic cyst versus endometriosis.  IBS. ? ?Discharge Instructions ? ?Discharge Instructions   ? ? Diet - low sodium heart healthy   Complete by: As directed ?  ? Increase activity slowly   Complete by: As directed ?  ? ?  ? ?Allergies as of 02/13/2022   ? ?   Reactions  ? Bee Venom Swelling  ? ?  ? ?  ?Medication List  ?  ? ?STOP taking these medications   ? ?amoxicillin 500 MG capsule ?Commonly known as: AMOXIL ?  ? ?  ? ?TAKE these medications   ? ?acetaminophen 500 MG tablet ?Commonly known as: TYLENOL ?Take 1,000 mg by mouth every 6 (six) hours as needed for moderate pain. ?  ?amitriptyline 10 MG tablet ?Commonly known as: ELAVIL ?Take 10 mg by mouth at bedtime. ?  ?clidinium-chlordiazePOXIDE 5-2.5 MG capsule ?Commonly known as: Librax ?Take 1 capsule by mouth 3 (three) times daily before meals. ?  ?CULTURELLE ADULT ULT BALANCE PO ?Take by mouth daily. ?  ?norethindrone 0.35 MG tablet ?Commonly known as: Ortho Micronor ?Take 1 tablet (0.35 mg total)  by mouth daily. ?  ?omeprazole 40 MG capsule ?Commonly known as: PRILOSEC ?Take 1 capsule (40 mg total) by mouth daily before breakfast. ?  ?ondansetron 4 MG tablet ?Commonly known as: ZOFRAN ?Take 1 tablet (4 mg total) by mouth every 8 (eight) hours as needed for nausea or vomiting. ?  ? ?  ? ?  Follow-up Information   ? ? [facility]. Schedule an appointment as soon as possible for a visit in 1 week(s).   ?Contact information: ?[address] ?[place] [address] ?[phone] ? ? ?  ?  ? ?  ?  ? ?  ? ?Allergies  ?Allergen Reactions  ? Bee Venom Swelling  ? ? ?Consultations: ?GI ?Gynecology ? ? ?Procedures/Studies: ?US PELVIS (TRANSABDOMINAL ONLY) ? ?Result Date: 02/12/2022 ?CLINICAL DATA:  Fluid around the uterus on CT yesterday. History of bilateral salpingectomy 08/2020, C-section, uterine ablation, and tubal ligation. EXAM: TRANSABDOMINAL ULTRASOUND OF PELVIS DOPPLER ULTRASOUND OF OVARIES TECHNIQUE: Transabdominal ultrasound examination of the pelvis was performed including evaluation of the uterus, ovaries, adnexal regions, and pelvic cul-de-sac. Color and duplex Doppler ultrasound was utilized to evaluate blood flow to the ovaries. COMPARISON:  CT abdomen and pelvis 02/11/2022; ultrasound pelvis 06/30/2020 FINDINGS: This study was technically limited due to patient body habitus and patient tolerance to transducer pressure. Uterus Measurements: 11.2 x 5.3 x 7.2 cm = volume: 222 mL. There is a predominantly hypoechoic complex collection abutting the anterior superior aspect of the uterus measuring up to approximately 6.2 x 6.6 x 4.1 cm, corresponding to the fluid with septation seen on CT. Endometrium Thickness: The endometrium is not well visualized. There appears to be fluid within the endometrium. The endometrial thickness measures approximately 10 mm. No increased color flow vascularity. Right ovary Not visualized. Left ovary Not visualized. Other: None. IMPRESSION: Note is made on yesterday CT of sigmoid colon diverticulitis and both free fluid and complex fluid within the pelvis bordering the anterior superior aspect of the uterus. This fluid seen on ultrasound measuring up to approximately 6.6 cm, ragain appears complex, and again an abscess cannot be excluded. Electronically Signed    By: Yvonne Kendall M.D.   On: 02/12/2022 17:55  ? ?CT ABDOMEN PELVIS W CONTRAST ? ?Result Date: 02/11/2022 ?CLINICAL DATA:  Abdominal pain, nausea and vomiting. EXAM: CT ABDOMEN AND PELVIS WITH CONTRAST TECHNIQUE: Multidetector CT imaging of the abdomen and pelvis was performed using the standard protocol following bolus administration of intravenous contrast. RADIATION DOSE REDUCTION: This exam was performed according to the departmental dose-optimization program which includes automated exposure control, adjustment of the mA and/or kV according to patient size and/or use of iterative reconstruction technique. CONTRAST:  136m OMNIPAQUE IOHEXOL 300 MG/ML  SOLN COMPARISON:  01/01/2021. FINDINGS: Lower chest: Lung bases are clear. Heart size normal. No pericardial or pleural effusion. Distal esophagus is unremarkable. Hepatobiliary: Liver is enlarged, 20.9 cm. Liver and gallbladder are otherwise unremarkable. No biliary ductal dilatation. Pancreas: Negative. Spleen: Negative. Adrenals/Urinary Tract: Adrenal glands and kidneys are unremarkable. Ureters are decompressed. Bladder is grossly unremarkable. Stomach/Bowel: Stomach, small bowel, appendix and majority of the colon are unremarkable. Pericolonic inflammatory haziness and stranding adjacent to the proximal sigmoid colon. No extraluminal air or organized fluid collection. Remainder of the rectosigmoid colon is unremarkable. Vascular/Lymphatic: Vascular structures are unremarkable. No pathologically enlarged lymph nodes. Reproductive: Septate or bicornuate uterus. 1.9 cm hypodense lesion in the uterus may represent a fibroid. No adnexal mass. Other: Small pelvic free fluid. There may be minimal associated peritoneal hyperattenuation  on the right. A rounded collection of fluid adjacent to the uterine fundus measures 3.1 x 5.8 cm (2/62). No associated peripheral hyperattenuation. 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 IMPRESSION: 1. Sigmoid diverticulitis with associated small ascites and possible early peritonitis. Loculated fluid adjacent to the uterine fundus. Difficult to exclude an i

## 2022-02-13 NOTE — Progress Notes (Signed)
Pt has discharge orders, discharge teaching given and no further questions at this time. Pt wants to walk out to car and not be wheeled down.  ?

## 2022-02-13 NOTE — Telephone Encounter (Signed)
Pt in hospital for pain, went ti ER 02/11/22 when pain was bad,had CT showed diverticulitis and possible early peritonitis and fluid adjacent to uterus. She says has seen GI and Dr Manuella Ghazi was going to talk with GYN , and he spoke with Dr Damita Dunnings, I told Deborah Humphrey that he should up date her on their conversation, and we discussed her note.  She says she will keep me posted.  ?

## 2022-02-13 NOTE — Progress Notes (Signed)
?Subjective: ?Patient states that she continues to have some continued Left lower abdominal pain though somewhat improved today, has not had a BM since Monday which was diarrhea, though this is typical for her during these "flares." She states she is passing some gas but still feeling bloated. She states that she has been in touch with her GYN provider for further evaluation of fluid found on Pelvic US yesterday.  ? ?Objective: ?Vital signs in last 24 hours: ?Temp:  [98 ?F (36.7 ?C)-98.5 ?F (36.9 ?C)] 98 ?F (36.7 ?C) (03/15 0604) ?Pulse Rate:  [66-77] 66 (03/15 0604) ?Resp:  [18-20] 20 (03/15 0604) ?BP: (103-122)/(73-81) 103/77 (03/15 0604) ?SpO2:  [96 %-99 %] 96 % (03/15 0604) ?Last BM Date : 02/11/22 ?General:   Alert and oriented, pleasant ?Head:  Normocephalic and atraumatic. ?Eyes:  No icterus, sclera clear. Conjuctiva pink.  ?Mouth:  Without lesions, mucosa pink and moist.  ?Heart:  S1, S2 present, no murmurs noted.  ?Lungs: Clear to auscultation bilaterally, without wheezing, rales, or rhonchi.  ?Abdomen:  Bowel sounds present, soft, non-distended. Mild TTP of LLQ. No HSM or hernias noted. No rebound or guarding. No masses appreciated  ?Msk:  Symmetrical without gross deformities. Normal posture. ?Pulses:  Normal pulses noted. ?Extremities:  Without clubbing or edema. ?Neurologic:  Alert and  oriented x4;  grossly normal neurologically. ?Skin:  Warm and dry, intact without significant lesions.  ?Psych:  Alert and cooperative. Normal mood and affect. ? ?Intake/Output from previous day: ?03/14 0701 - 03/15 0700 ?In: 1340.8 [P.O.:700; IV Piggyback:640.8] ?Out: -  ?Intake/Output this shift: ?No intake/output data recorded. ? ?Lab Results: ?Recent Labs  ?  02/11/22 ?0086 02/12/22 ?0440  ?WBC 7.2 4.5  ?HGB 14.8 12.8  ?HCT 43.1 39.8  ?PLT 187 156  ? ?BMET ?Recent Labs  ?  02/11/22 ?7619 02/12/22 ?0440  ?NA 135 138  ?K 3.6 3.8  ?CL 106 106  ?CO2 19* 25  ?GLUCOSE 123* 90  ?BUN 10 9  ?CREATININE 0.89 0.92  ?CALCIUM  8.7* 8.4*  ? ?LFT ?Recent Labs  ?  02/11/22 ?5093 02/12/22 ?0440  ?PROT 7.1 6.0*  ?ALBUMIN 4.0 3.3*  ?AST 24 14*  ?ALT 19 15  ?ALKPHOS 80 63  ?BILITOT 0.6 0.5  ? ?Studies/Results: ?US PELVIS (TRANSABDOMINAL ONLY) ? ?Result Date: 02/12/2022 ?CLINICAL DATA:  Fluid around the uterus on CT yesterday. History of bilateral salpingectomy 08/2020, C-section, uterine ablation, and tubal ligation. EXAM: TRANSABDOMINAL ULTRASOUND OF PELVIS DOPPLER ULTRASOUND OF OVARIES TECHNIQUE: Transabdominal ultrasound examination of the pelvis was performed including evaluation of the uterus, ovaries, adnexal regions, and pelvic cul-de-sac. Color and duplex Doppler ultrasound was utilized to evaluate blood flow to the ovaries. COMPARISON:  CT abdomen and pelvis 02/11/2022; ultrasound pelvis 06/30/2020 FINDINGS: This study was technically limited due to patient body habitus and patient tolerance to transducer pressure. Uterus Measurements: 11.2 x 5.3 x 7.2 cm = volume: 222 mL. There is a predominantly hypoechoic complex collection abutting the anterior superior aspect of the uterus measuring up to approximately 6.2 x 6.6 x 4.1 cm, corresponding to the fluid with septation seen on CT. Endometrium Thickness: The endometrium is not well visualized. There appears to be fluid within the endometrium. The endometrial thickness measures approximately 10 mm. No increased color flow vascularity. Right ovary Not visualized. Left ovary Not visualized. Other: None. IMPRESSION: Note is made on yesterday CT of sigmoid colon diverticulitis and both free fluid and complex fluid within the pelvis bordering the anterior superior aspect of the uterus. This fluid  seen on ultrasound measuring up to approximately 6.6 cm, ragain appears complex, and again an abscess cannot be excluded. Electronically Signed   By: Yvonne Kendall M.D.   On: 02/12/2022 17:55  ? ?CT ABDOMEN PELVIS W CONTRAST ? ?Result Date: 02/11/2022 ?CLINICAL DATA:  Abdominal pain, nausea and vomiting.  EXAM: CT ABDOMEN AND PELVIS WITH CONTRAST TECHNIQUE: Multidetector CT imaging of the abdomen and pelvis was performed using the standard protocol following bolus administration of intravenous contrast. RADIATION DOSE REDUCTION: This exam was performed according to the departmental dose-optimization program which includes automated exposure control, adjustment of the mA and/or kV according to patient size and/or use of iterative reconstruction technique. CONTRAST:  127m OMNIPAQUE IOHEXOL 300 MG/ML  SOLN COMPARISON:  01/01/2021. FINDINGS: Lower chest: Lung bases are clear. Heart size normal. No pericardial or pleural effusion. Distal esophagus is unremarkable. Hepatobiliary: Liver is enlarged, 20.9 cm. Liver and gallbladder are otherwise unremarkable. No biliary ductal dilatation. Pancreas: Negative. Spleen: Negative. Adrenals/Urinary Tract: Adrenal glands and kidneys are unremarkable. Ureters are decompressed. Bladder is grossly unremarkable. Stomach/Bowel: Stomach, small bowel, appendix and majority of the colon are unremarkable. Pericolonic inflammatory haziness and stranding adjacent to the proximal sigmoid colon. No extraluminal air or organized fluid collection. Remainder of the rectosigmoid colon is unremarkable. Vascular/Lymphatic: Vascular structures are unremarkable. No pathologically enlarged lymph nodes. Reproductive: Septate or bicornuate uterus. 1.9 cm hypodense lesion in the uterus may represent a fibroid. No adnexal mass. Other: Small pelvic free fluid. There may be minimal associated peritoneal hyperattenuation on the right. A rounded collection of fluid adjacent to the uterine fundus measures 3.1 x 5.8 cm (2/62). No associated peripheral hyperattenuation. 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 IMPRESSION: 1. Sigmoid diverticulitis with associated small ascites and possible early peritonitis. Loculated fluid  adjacent to the uterine fundus. Difficult to exclude an impending abscess. 2. Hepatomegaly. Electronically Signed   By: MLorin PicketM.D.   On: 02/11/2022 10:36   ? ?Assessment: ?Deborah Beaudryis a 46year old female with history of GERD, Barrett's esophagus, IBS, anxiety, OCD, who presented to the emergency room due to worsening left lower quadrant abdominal pain with a single episode of nausea and vomiting.  No significant abnormalities on laboratory work-up.  CT A/P concerning for colitis, She was started on IV antibiotics. GI was consulted for further evaluation/additional recommendations per patient's request due to chronicity of symptoms.  ? ?Abnormal CT:CT A/P with pericolonic inflammatory haziness and stranding adjacent to the proximal sigmoid colon, associated small ascites, and possible early peritonitis, though presentation not consistent with peritonitis.  Loculated fluid adjacent to the uterine fundus, difficult to exclude impending abscess (3.1 x 5.8 cm).  Hospitalist reviewed CT with general surgery who stated it did not appear she had a true abscess, and no diverticula noted on imaging, suspected colitis. IV antibiotics stopped yesterday and pelvic UKoreaperformed with free fluid and complex fluid near uterus. GYN consult, patient started on oral contraceptive progesterone. Pt has been in contact with Primary GYN provider with plans for follow up on outpatient basis with them. ? ?Acute on Chronic abdominal Pain: chronic history of intermittent lower abdominal pain. Associated diarrhea followed by constipation with occasional n/v. No rectal bleeding or melena. Previously thought secondary to IBS, started on Librax and Elavil in February without much improvement. Previously on Levsin. Last Colonoscopy march 2022 with normal colon, bx negative for microscopic colitis. Pain likely influenced by findings on pelvic UKoreathis admission, though  I did discuss trial of low FODMAP diet and food journal x2 weeks to  help correlate symptoms to dietary intake.  ? ?GERD: chronic, well controlled on omeprazole '40mg'$  daily, outpatient ? ?Dysphagia: few episodes of solid food dysphagia over the past few months, no previous esop

## 2022-02-13 NOTE — Consult Note (Signed)
? ?OB/GYN Telephone Consult ? ?Deborah Humphrey is a 45 y.o. G1P1001 presenting with LLQ pain in s/o GERD and IBS.  ? ?I was called for a consult regarding the care of this patient by [facility].   ? ?The provider had a clinical question regarding management of a complex fluid collection anterior to her uterus.   ? ?The provider presented the following relevant clinical information and I performed a chart review on the patient and reviewed available documentation: ? ?Deborah Humphrey is a patient of [facility] who is a 1/1 with a history of c-section, salpingectomy, and endometrial ablation who presented to the hospital with abdominal pain similar to her IBS in the past. She had a CT which showed a fluid collection anterior to her uterus. The fluid collection was then visualized on [place] and was found to be complex in appearance. It is not clear if it is walled off or associated with a cyst.  ? ?She had her endometrial ablation for menorrhagia but also at that time had pain with intercourse. No endometriosis was noted on the operative note by Dr. Glo Herring at that time.   ? ?She has been afebrile during her admission with normal WBC. Her HGB is normal.  ? ?I reviewed her pelvic ultrasound images independently and my findings are: consistent with the radiologist's description. The fluid appears consistent with blood and does not have any finding that would suggest CT as the blood is homogenous in appearance. The cyst is about 6x6x4 cm. This [place] was transabdominal only.  ? ?On her prior US 07/07/2020 there was felt to be a 3cm anterior fibroid. I reviewed those images as well and agree that this finding is different from the current appearance of her [place].  ? ? ?BP 103/77 (BP Location: Left Arm)   Pulse 66   Temp 98 ?F (36.7 ?C) (Oral)   Resp 20   Ht '5\' 3"'$  (1.6 m)   Wt 96.1 kg   SpO2 96%   BMI 37.52 kg/m?   ?Exam- performed by consulting provider ? ? ?Recommendations:  ?- Differential diagnosis is hemorrhagic cyst that is  ruptured, endometriosis of c-section scar, endometrioma, hemorrhagic cyst that is not ruptured. I think ruptured cyst is unlikely as I would expect the fluid to collect posteriorly. Additionally the patient has cyclic pain around the time of her cycle making something endometriotic more likely ?- I would recommend a repeat US at [facility] in 6 weeks. I have sent a message to the office to schedule her for this and an physician appt to follow the [place] in case surgical intervention is needed ?- In the interim, I would recommend continuous birth control pills until this is establish to help suppress endometriosis/worsening endometriosis in the interim if it is the cause. 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 This should not be a problem now s/p endometrial ablation. I reviewed her medical history and due to age and smoking, I would avoid estrogen. She can do continuous progesterone only pill (Norethindrone 0.35 mg daily) if she would like to try hormonal suppression.  ?- Otherwise, I would recommend supportive care with ibuprofen and tylenol.  ? ? ?Thank you for this consult and if additional recommendations are needed please call [phone] for the OB/GYN attending on service at [facility].  ? ?I spent approximately 5 minutes directly consulting with the provider and verbally discussing this case. Additionally 15 minutes minutes was spent performing chart review and documentation.  ? ?  Radene Gunning, MD ?Attending [place] ?[facility] [place] ?

## 2022-02-18 ENCOUNTER — Encounter (INDEPENDENT_AMBULATORY_CARE_PROVIDER_SITE_OTHER): Payer: Self-pay | Admitting: Gastroenterology

## 2022-02-18 ENCOUNTER — Telehealth: Payer: Self-pay | Admitting: Adult Health

## 2022-02-18 NOTE — Telephone Encounter (Signed)
Has appt 4/25 for Deborah Humphrey and to see Dr Elonda Husky, take Micronor, till US,she has appts with PCP tomorrow.Feels like she may want some counseling. Will talk with PCP ?

## 2022-02-20 NOTE — Telephone Encounter (Signed)
Hospital follow up scheduled

## 2022-03-15 ENCOUNTER — Encounter: Payer: Self-pay | Admitting: Obstetrics & Gynecology

## 2022-03-15 ENCOUNTER — Ambulatory Visit: Payer: 59 | Admitting: Obstetrics & Gynecology

## 2022-03-15 VITALS — BP 125/79 | HR 87 | Ht 62.0 in | Wt 212.0 lb

## 2022-03-15 DIAGNOSIS — R1900 Intra-abdominal and pelvic swelling, mass and lump, unspecified site: Secondary | ICD-10-CM | POA: Diagnosis not present

## 2022-03-15 DIAGNOSIS — R102 Pelvic and perineal pain: Secondary | ICD-10-CM | POA: Diagnosis not present

## 2022-03-15 MED ORDER — CIPROFLOXACIN HCL 500 MG PO TABS: 500.0000 mg | ORAL_TABLET | Freq: Two times a day (BID) | ORAL | 0 refills | Status: DC

## 2022-03-15 MED ORDER — METRONIDAZOLE 500 MG PO TABS: 500.0000 mg | ORAL_TABLET | Freq: Two times a day (BID) | ORAL | 0 refills | Status: DC

## 2022-03-15 MED ORDER — KETOROLAC TROMETHAMINE 10 MG PO TABS
10.0000 mg | ORAL_TABLET | Freq: Three times a day (TID) | ORAL | 0 refills | Status: DC | PRN
Start: 2022-03-15 — End: 2022-10-07

## 2022-03-15 NOTE — Progress Notes (Signed)
Chief Complaint  Patient presents with   Pelvic Pain      46 y.o. G1P1001 No LMP recorded. Patient has had an ablation. The current method of family planning is tubal ligation.  Outpatient Encounter Medications as of 03/15/2022  Medication Sig   ketorolac (TORADOL) 10 MG tablet Take 1 tablet (10 mg total) by mouth every 8 (eight) hours as needed. (Patient not taking: Reported on 06/06/2022)   Lactobacillus-Inulin (CULTURELLE ADULT ULT BALANCE PO) Take 1 capsule by mouth daily.   [DISCONTINUED] acetaminophen (TYLENOL) 500 MG tablet Take 1,000 mg by mouth every 6 (six) hours as needed for moderate pain. (Patient not taking: Reported on 04/23/2022)   [DISCONTINUED] amitriptyline (ELAVIL) 10 MG tablet Take 10 mg by mouth at bedtime.   [DISCONTINUED] ciprofloxacin (CIPRO) 500 MG tablet Take 1 tablet (500 mg total) by mouth 2 (two) times daily. (Patient not taking: Reported on 04/23/2022)   [DISCONTINUED] clidinium-chlordiazePOXIDE (LIBRAX) 5-2.5 MG capsule Take 1 capsule by mouth 3 (three) times daily before meals.   [DISCONTINUED] Lactobacillus Rhamnosus, GG, (PROBIOTIC COLIC PO) Take by mouth. (Patient not taking: Reported on 04/23/2022)   [DISCONTINUED] metroNIDAZOLE (FLAGYL) 500 MG tablet Take 1 tablet (500 mg total) by mouth 2 (two) times daily. (Patient not taking: Reported on 04/23/2022)   [DISCONTINUED] norethindrone (ORTHO MICRONOR) 0.35 MG tablet Take 1 tablet (0.35 mg total) by mouth daily.   [DISCONTINUED] omeprazole (PRILOSEC) 40 MG capsule Take 1 capsule (40 mg total) by mouth daily before breakfast.   [DISCONTINUED] ondansetron (ZOFRAN) 4 MG tablet Take 1 tablet (4 mg total) by mouth every 8 (eight) hours as needed for nausea or vomiting.   No facility-administered encounter medications on file as of 03/15/2022.    Subjective Pt with increasing pelvic pain over the past few months CT-->complex fluid collection anterior to uterus  Ms. Eichhorn is a patient of [facility] who is a 1/1  with a history of c-section, salpingectomy, and endometrial ablation who presented to the hospital with abdominal pain similar to her IBS in the past. She had a CT which showed a fluid collection anterior to her uterus. The fluid collection was then visualized on [place] and was found to be complex in appearance. It is not clear if it is walled off or associated with a cyst.    She had her endometrial ablation for menorrhagia but also at that time had pain with intercourse. No endometriosis was noted on the operative note by Dr. Glo Herring at that time.  Past Medical History:  Diagnosis Date   Anxiety    Cervical spine fracture (HCC)    Denies havign surgery    gestational hypertention    gestational hypertension   Helicobacter pylori (H. pylori)    IBS (irritable bowel syndrome)    Infertility, female     Past Surgical History:  Procedure Laterality Date   ABDOMINAL HYSTERECTOMY N/A 05/01/2022   Procedure: HYSTERECTOMY ABDOMINAL;  Surgeon: Florian Buff, MD;  Location: [facility];  Service: Gynecology;  Laterality: N/A;   BIOPSY  02/28/2021   Procedure: BIOPSY;  Surgeon: Harvel Quale, MD;  [facility];  Service: Gastroenterology;;   CESAREAN SECTION     COLONOSCOPY WITH PROPOFOL N/A 02/28/2021   Procedure: COLONOSCOPY WITH PROPOFOL;  Surgeon: Harvel Quale, MD;  [facility]: [facility];  Service: Gastroenterology;  Laterality: N/A;  AM   DILATATION AND CURETTAGE/HYSTEROSCOPY WITH MINERVA N/A 08/22/2020   Procedure: DILATATION AND CURETTAGE/HYSTEROSCOPY WITH MINERVA;  Surgeon: Mallory Shirk  V, MD;  Location: [facility];  Service: Gynecology;  Laterality: N/A;   DILATION AND CURETTAGE OF UTERUS     x3   ESOPHAGOGASTRODUODENOSCOPY (EGD) WITH PROPOFOL N/A 02/28/2021   Procedure: ESOPHAGOGASTRODUODENOSCOPY (EGD) WITH PROPOFOL;  Surgeon: Harvel Quale, MD;  [facility];  Service: Gastroenterology;  Laterality: N/A;   HALO APPLICATION     age 25    LAPAROSCOPIC BILATERAL SALPINGECTOMY Bilateral 08/22/2020   Procedure: LAPAROSCOPIC BILATERAL SALPINGECTOMY;  Surgeon: Jonnie Kind, MD;  Location: [facility];  Service: Gynecology;  Laterality: Bilateral;   OOPHORECTOMY Right 05/01/2022   Procedure: OOPHORECTOMY;  Surgeon: Florian Buff, MD;  Location: [facility];  Service: Gynecology;  Laterality: Right;    OB History     Gravida  1   Para  1   Term  1   Preterm      AB      Living  1      SAB      IAB      Ectopic      Multiple      Live Births              Allergies  Allergen Reactions   Bee Venom Swelling    Social History   Socioeconomic History   Marital status: Married    Spouse name: Not on file   Number of children: Not on file   Years of education: Not on file   Highest education level: Not on file  Occupational History   Not on file  Tobacco Use   Smoking status: Former    Packs/day: 1.00    Years: 15.00    Total pack years: 15.00    Types: Cigarettes    Quit date: 01/31/2011    Years since quitting: 11.5   Smokeless tobacco: Never  Vaping Use   Vaping Use: Never used  Substance and Sexual Activity   Alcohol use: Yes    Comment: occassional   Drug use: No   Sexual activity: Not Currently    Birth control/protection: Surgical    Comment: tubal and ablation  Other Topics Concern   Not on file  Social History Narrative   Not on file   Social Determinants of Health   Financial Resource Strain: Low Risk  (01/28/2022)   Overall Financial Resource Strain (CARDIA)    Difficulty of Paying Living Expenses: Not hard at all  Food Insecurity: No Food Insecurity (01/28/2022)   Hunger Vital Sign    Worried About Running Out of Food in the Last Year: Never true    Ran Out of Food in the Last Year: Never true  Transportation Needs: No Transportation Needs (01/28/2022)   PRAPARE - Hydrologist (Medical): No    Lack of Transportation (Non-Medical): No  Physical  Activity: Insufficiently Active (01/28/2022)   Exercise Vital Sign    Days of Exercise per Week: 1 day    Minutes of Exercise per Session: 20 min  Stress: Stress Concern Present (01/28/2022)   [place]    Feeling of Stress : Very much  Social Connections: Socially Integrated (01/28/2022)   Social Connection and Isolation Panel [NHANES]    Frequency of Communication with Friends and Family: More than three times a week    Frequency of Social Gatherings with Friends and Family: More than three times a week    Attends Religious Services: More than 4 times per year  Active Member of Clubs or Organizations: Yes    Attends Music therapist: More than 4 times per year    Marital Status: Married    Family History  Problem Relation Age of Onset   Cancer Mother        skin   Cancer Father        pancreatic   Breast cancer Sister    Polycystic ovary syndrome Sister    Endometriosis Sister    Breast cancer Sister    Dementia Maternal Grandmother    Cancer Paternal Grandmother        liver   Cancer Paternal Grandfather        pancreatic?   Breast cancer Paternal Aunt     Medications:       Current Outpatient Medications:    ketorolac (TORADOL) 10 MG tablet, Take 1 tablet (10 mg total) by mouth every 8 (eight) hours as needed. (Patient not taking: Reported on 06/06/2022), Disp: 15 tablet, Rfl: 0   Lactobacillus-Inulin (CULTURELLE ADULT ULT BALANCE PO), Take 1 capsule by mouth daily., Disp: , Rfl:    L-THEANINE PO, Take 1 capsule by mouth daily., Disp: , Rfl:    LORazepam (ATIVAN) 0.5 MG tablet, Take 0.5 mg by mouth 2 (two) times daily., Disp: , Rfl:    omeprazole (PRILOSEC) 40 MG capsule, TAKE 1 CAPSULE BY MOUTH EVERY DAY BEFORE breakfast, Disp: 90 capsule, Rfl: 3   ondansetron (ZOFRAN) 8 MG tablet, Take 1 tablet (8 mg total) by mouth every 8 (eight) hours as needed for nausea., Disp: 12 tablet, Rfl: 0   OVER  THE COUNTER MEDICATION, Take 1 capsule by mouth daily. Plant Enzymes (Patient not taking: Reported on 06/06/2022), Disp: , Rfl:    Plecanatide (TRULANCE) 3 MG TABS, Take 1 tablet by mouth daily. (Patient not taking: Reported on 06/06/2022), Disp: 90 tablet, Rfl: 3   promethazine (PHENERGAN) 25 MG tablet, Take 1 tablet (25 mg total) by mouth every 6 (six) hours as needed for nausea. (Patient not taking: Reported on 06/06/2022), Disp: 12 tablet, Rfl: 1   terconazole (TERAZOL 7) 0.4 % vaginal cream, Place 1 applicator vaginally at bedtime., Disp: 45 g, Rfl: 0  Objective Blood pressure 125/79, pulse 87, height '5\' 2"'$  (1.575 m), weight 212 lb (96.2 kg).  General WDWN female NAD Vulva:  normal appearing vulva with no masses, tenderness or lesions Vagina:  normal mucosa, no discharge Cervix:  Normal no lesions Uterus:  enlarged on exam tender 14 weeks size, right adnexal fullness Adnexa: ovaries:present,  as above   Pertinent ROS Per HPI No burning with urination, frequency or urgency No nausea, vomiting or diarrhea Nor fever chills or other constitutional symptoms   Labs or studies     Impression + Management Plan: Diagnoses this Encounter::   ICD-10-CM   1. Pelvic mass in female  R19.00     2. Pelvic pain  R10.2         Medications prescribed during  this encounter: Meds ordered this encounter  Medications   DISCONTD: ciprofloxacin (CIPRO) 500 MG tablet    Sig: Take 1 tablet (500 mg total) by mouth 2 (two) times daily.    Dispense:  20 tablet    Refill:  0   DISCONTD: metroNIDAZOLE (FLAGYL) 500 MG tablet    Sig: Take 1 tablet (500 mg total) by mouth 2 (two) times daily.    Dispense:  20 tablet    Refill:  0   ketorolac (TORADOL) 10 MG tablet  Sig: Take 1 tablet (10 mg total) by mouth every 8 (eight) hours as needed.    Dispense:  15 tablet    Refill:  0    Labs or Scans Ordered during this encounter: No orders of the defined types were placed in this  encounter.     Follow up Return for keep scheduled.

## 2022-03-16 ENCOUNTER — Other Ambulatory Visit (INDEPENDENT_AMBULATORY_CARE_PROVIDER_SITE_OTHER): Payer: Self-pay | Admitting: Gastroenterology

## 2022-03-16 DIAGNOSIS — K2270 Barrett's esophagus without dysplasia: Secondary | ICD-10-CM

## 2022-03-16 DIAGNOSIS — K219 Gastro-esophageal reflux disease without esophagitis: Secondary | ICD-10-CM

## 2022-03-21 ENCOUNTER — Other Ambulatory Visit (HOSPITAL_COMMUNITY): Payer: Self-pay | Admitting: Adult Health

## 2022-03-21 ENCOUNTER — Ambulatory Visit (INDEPENDENT_AMBULATORY_CARE_PROVIDER_SITE_OTHER): Payer: 59 | Admitting: Gastroenterology

## 2022-03-21 ENCOUNTER — Other Ambulatory Visit (INDEPENDENT_AMBULATORY_CARE_PROVIDER_SITE_OTHER): Payer: Self-pay | Admitting: Gastroenterology

## 2022-03-21 DIAGNOSIS — R112 Nausea with vomiting, unspecified: Secondary | ICD-10-CM

## 2022-03-21 DIAGNOSIS — Z1231 Encounter for screening mammogram for malignant neoplasm of breast: Secondary | ICD-10-CM

## 2022-03-25 ENCOUNTER — Other Ambulatory Visit: Payer: Self-pay | Admitting: Obstetrics & Gynecology

## 2022-03-25 DIAGNOSIS — R188 Other ascites: Secondary | ICD-10-CM

## 2022-03-26 ENCOUNTER — Encounter (INDEPENDENT_AMBULATORY_CARE_PROVIDER_SITE_OTHER): Payer: Self-pay | Admitting: Gastroenterology

## 2022-03-26 ENCOUNTER — Encounter: Payer: Self-pay | Admitting: Obstetrics & Gynecology

## 2022-03-26 ENCOUNTER — Ambulatory Visit (INDEPENDENT_AMBULATORY_CARE_PROVIDER_SITE_OTHER): Payer: 59

## 2022-03-26 ENCOUNTER — Ambulatory Visit: Payer: 59 | Admitting: Obstetrics & Gynecology

## 2022-03-26 VITALS — BP 122/86 | HR 76 | Ht 62.0 in | Wt 212.0 lb

## 2022-03-26 DIAGNOSIS — R188 Other ascites: Secondary | ICD-10-CM

## 2022-03-26 DIAGNOSIS — R1900 Intra-abdominal and pelvic swelling, mass and lump, unspecified site: Secondary | ICD-10-CM

## 2022-03-26 DIAGNOSIS — R102 Pelvic and perineal pain: Secondary | ICD-10-CM

## 2022-03-26 DIAGNOSIS — N83201 Unspecified ovarian cyst, right side: Secondary | ICD-10-CM | POA: Diagnosis not present

## 2022-03-26 NOTE — Progress Notes (Signed)
PELVIC US TA/TV: heterogeneous subseptate uterus anteverted uterus,EEC 3 mm (difficult to visualize endometrial boarders),complex hypoechoic lobulated endometrial mass with color flow 4 x 1.8 x 2 cm,complex right ovarian cyst with mural nodule (no color flow visualized) 5 x 4 x 4 mm,normal left ovary,complex fluid collection with septation superior to uterus (fundal) 5 x 4.6 x 2.3 cm ?

## 2022-03-26 NOTE — Progress Notes (Signed)
Follow up appointment for results: ?Gyn sonogram ? ?Chief Complaint  ?Patient presents with  ? Follow-up  ?  [place] today  ? ? ?Blood pressure 122/86, pulse 76, height '5\' 2"'$  (1.575 m), weight 212 lb (96.2 kg). ? ?US PELVIC COMPLETE WITH TRANSVAGINAL ? ?Result Date: 03/26/2022 ?Images from the original result were not included.  ..an Sales executive of Ultrasound Medicine Diplomatic Services operational officer) accredited practice Center for [facility] @ [place] [address] Ordering Provider: Florian Buff, MD                                                                                    GYNECOLOGIC SONOGRAM Deborah Humphrey is a 46 y.o. G1P1001 No LMP recorded. Patient has had an ablation.She is here for a pelvic sonogram for pelvic pain, follow up fluid collection . Uterus                      7.6 x 5.6 x 7.1 cm, Total uterine volume 160 cc, heterogeneous subseptate uterus anteverted uterus Endometrium          3 mm, symmetrical, difficult to visualize endometrial boarders,complex hypoechoic lobulated endometrial mass with color flow 4 x 1.8 x 2 cm Right ovary             4.6 x 2.6 x 3.5 cm, complex right ovarian cyst with a mural nodule (no color flow visualized) 5 x 4 x 4 mm Left ovary                3.2 x 3.5 x 2.8 cm, normal Technician Comments: PELVIC US TA/TV: heterogeneous subseptate uterus anteverted uterus,EEC 3 mm (difficult to visualize endometrial boarders),complex hypoechoic lobulated endometrial mass with color flow 4 x 1.8 x 2 cm,complex right ovarian cyst with mural nodule (no color flow visualized) 5 x 4 x 4 mm,normal left ovary,complex fluid collection with septation superior to uterus (fundal) 5 x 4.6 x 2.3 cm [employer] 03/26/2022 11:59 AM Clinical Impression and recommendations: I have reviewed the sonogram results above, combined with the patient's current clinical course, below are my impressions and any appropriate recommendations for management based on the sonographic findings.  Uterus normal size with probable degenerating fibroid Endometrium thin small fluid consistent with ablation Ovaries: right ovary with complex probable hemorrhagic cyst, left ovary normal Florian Buff 03/26/2022 12:34 PM   ? ? ? ?MEDS ordered this encounter: ?No orders of the defined types were placed in this encounter. ? ? ?Orders for this encounter: ?No orders of the defined types were placed in this encounter. ? ? ?Impression + Management Plan ?  ICD-10-CM   ?1. Pelvic mass in female  R19.00   ? painful degenrating fibroid as source of pain, no evidence of endometriosis, recommend definitive surgical management  ?  ?2. Pelvic pain  R10.2   ?  ?3. Ovarian cyst, right  N83.201   ? complex, remove at time of hysterectomy  ?  ? ? ?Follow Up: ?Return in about 6 weeks (around 05/10/2022) for [place], with Dr Elonda Husky. ? ? ? ? All questions were answered. ? ?Past Medical History:  ?Diagnosis  Date  ? Anxiety   ? Cervical spine fracture (HCC)   ? Denies havign surgery   ? gestational hypertention   ? gestational hypertension  ? Helicobacter pylori (H. pylori)   ? IBS (irritable bowel syndrome)   ? Infertility, female   ? ? ?Past Surgical History:  ?Procedure Laterality Date  ? BIOPSY  02/28/2021  ? Procedure: BIOPSY;  Surgeon: Harvel Quale, MD;  [facility];  Service: Gastroenterology;;  ? CESAREAN SECTION    ? COLONOSCOPY WITH PROPOFOL N/A 02/28/2021  ? Procedure: COLONOSCOPY WITH PROPOFOL;  Surgeon: Harvel Quale, MD;  [facility];  Service: Gastroenterology;  Laterality: N/A;  AM  ? DILATATION AND CURETTAGE/HYSTEROSCOPY WITH MINERVA N/A 08/22/2020  ? Procedure: DILATATION AND CURETTAGE/HYSTEROSCOPY WITH MINERVA;  Surgeon: Jonnie Kind, MD;  Location: [facility];  Service: Gynecology;  Laterality: N/A;  ? DILATION AND CURETTAGE OF UTERUS    ? x3  ? ESOPHAGOGASTRODUODENOSCOPY (EGD) WITH PROPOFOL N/A 02/28/2021  ? Procedure: ESOPHAGOGASTRODUODENOSCOPY (EGD) WITH PROPOFOL;  Surgeon:  Harvel Quale, MD;  [facility];  Service: Gastroenterology;  Laterality: N/A;  ? HALO APPLICATION    ? age 77  ? LAPAROSCOPIC BILATERAL SALPINGECTOMY Bilateral 08/22/2020  ? Procedure: LAPAROSCOPIC BILATERAL SALPINGECTOMY;  Surgeon: Jonnie Kind, MD;  Location: [facility];  Service: Gynecology;  Laterality: Bilateral;  ? ? ?OB History   ? ? Gravida  ?1  ? Para  ?1  ? Term  ?1  ? Preterm  ?   ? AB  ?   ? Living  ?1  ?  ? ? SAB  ?   ? IAB  ?   ? Ectopic  ?   ? Multiple  ?   ? Live Births  ?   ?   ?  ?  ? ? ?Allergies  ?Allergen Reactions  ? Bee Venom Swelling  ? ? ?Social History  ? ?Socioeconomic History  ? Marital status: Married  ?  Spouse name: Not on file  ? Number of children: Not on file  ? Years of education: Not on file  ? Highest education level: Not on file  ?Occupational History  ? Not on file  ?Tobacco Use  ? Smoking status: Former  ?  Packs/day: 1.00  ?  Years: 15.00  ?  Pack years: 15.00  ?  Types: Cigarettes  ?  Quit date: 01/31/2011  ?  Years since quitting: 11.1  ? Smokeless tobacco: Never  ?Vaping Use  ? Vaping Use: Never used  ?Substance and Sexual Activity  ? Alcohol use: Yes  ?  Comment: occassional  ? Drug use: No  ? Sexual activity: Yes  ?  Birth control/protection: Surgical  ?  Comment: tubal and ablation  ?Other Topics Concern  ? Not on file  ?Social History Narrative  ? Not on file  ? ?Social Determinants of Health  ? ?Financial Resource Strain: Low Risk   ? Difficulty of Paying Living Expenses: Not hard at all  ?Food Insecurity: No Food Insecurity  ? Worried About Charity fundraiser in the Last Year: Never true  ? Ran Out of Food in the Last Year: Never true  ?Transportation Needs: No Transportation Needs  ? Lack of Transportation (Medical): No  ? Lack of Transportation (Non-Medical): No  ?Physical Activity: Insufficiently Active  ? Days of Exercise per Week: 1 day  ? Minutes of Exercise per Session: 20 min  ?Stress: Stress Concern Present  ? Feeling  of Stress : Very  much  ?Social Connections: Socially Integrated  ? Frequency of Communication with Friends and Family: More than three times a week  ? Frequency of Social Gatherings with Friends and Family: More than three times a week  ? Attends Religious Services: More than 4 times per year  ? Active Member of Clubs or Organizations: Yes  ? Attends Archivist Meetings: More than 4 times per year  ? Marital Status: Married  ? ? ?Family History  ?Problem Relation Age of Onset  ? Cancer Paternal Grandfather   ?     pancreatic?  ? Cancer Paternal Grandmother   ?     liver  ? Dementia Maternal Grandmother   ? Cancer Father   ?     pancreatic  ? Cancer Mother   ?     skin  ? Breast cancer Sister   ? Polycystic ovary syndrome Sister   ? Endometriosis Sister   ? Breast cancer Sister   ? Breast cancer Paternal Aunt   ? ? ?

## 2022-03-27 ENCOUNTER — Encounter (HOSPITAL_COMMUNITY): Payer: Self-pay

## 2022-03-27 ENCOUNTER — Ambulatory Visit (HOSPITAL_COMMUNITY)
Admission: RE | Admit: 2022-03-27 | Discharge: 2022-03-27 | Disposition: A | Discharge status: Home / Self Care | Payer: 59 | Source: Ambulatory Visit | Attending: Adult Health | Admitting: Adult Health

## 2022-03-27 DIAGNOSIS — Z1231 Encounter for screening mammogram for malignant neoplasm of breast: Secondary | ICD-10-CM | POA: Diagnosis not present

## 2022-04-01 ENCOUNTER — Ambulatory Visit (INDEPENDENT_AMBULATORY_CARE_PROVIDER_SITE_OTHER): Payer: 59 | Admitting: Gastroenterology

## 2022-04-15 ENCOUNTER — Encounter: Payer: Self-pay | Admitting: Obstetrics & Gynecology

## 2022-04-25 NOTE — Patient Instructions (Addendum)
Your procedure is scheduled on: 05/01/2022  Report to [facility] at    6:00 AM.  Call this number if you have problems the morning of surgery: [phone]   Remember:   Do not Eat or Drink after midnight Except carb loading drink at 3:30 am        No Smoking the morning of surgery  :  Take these medicines the morning of surgery with A SIP OF WATER: Ativan and Omeprazole   Do not wear jewelry, make-up or nail polish.  Do not wear lotions, powders, or perfumes. You may wear deodorant.  Do not shave 48 hours prior to surgery. Men may shave face and neck.  Do not bring valuables to the hospital.  Contacts, dentures or bridgework may not be worn into surgery.  Leave suitcase in the car. After surgery it may be brought to your room.  For patients admitted to the hospital, checkout time is 11:00 AM the day of discharge.   Patients discharged the day of surgery will not be allowed to drive home.    Special Instructions: Shower using CHG night before surgery and shower the day of surgery use CHG.  Use special wash - you have one bottle of CHG for all showers.  You should use approximately 1/2 of the bottle for each shower.  How to Use Chlorhexidine for Bathing Chlorhexidine gluconate (CHG) is a germ-killing (antiseptic) solution that is used to clean the skin. It can get rid of the bacteria that normally live on the skin and can keep them away for about 24 hours. To clean your skin with CHG, you may be given: A CHG solution to use in the shower or as part of a sponge bath. A prepackaged cloth that contains CHG. Cleaning your skin with CHG may help lower the risk for infection: While you are staying in the intensive care unit of the hospital. If you have a vascular access, such as a central line, to provide short-term or long-term access to your veins. If you have a catheter to drain urine from your bladder. If you are on a ventilator. A ventilator is a machine that helps you  breathe by moving air in and out of your lungs. After surgery. What are the risks? Risks of using CHG include: A skin reaction. Hearing loss, if CHG gets in your ears and you have a perforated eardrum. Eye injury, if CHG gets in your eyes and is not rinsed out. The CHG product catching fire. Make sure that you avoid smoking and flames after applying CHG to your skin. Do not use CHG: If you have a chlorhexidine allergy or have previously reacted to chlorhexidine. On babies younger than 52 months of age. How to use CHG solution Use CHG only as told by your health care provider, and follow the instructions on the label. Use the full amount of CHG as directed. Usually, this is one bottle. During a shower Follow these steps when using CHG solution during a shower (unless your health care provider gives you different instructions): Start the shower. Use your normal soap and shampoo to wash your face and hair. Turn off the shower or move out of the shower stream. Pour the CHG onto a clean washcloth. Do not use any type of brush or rough-edged sponge. Starting at your neck, lather your body down to your toes. Make sure you follow these instructions: If you will be having surgery, pay special attention to the part of your  body where you will be having surgery. Scrub this area for at least 1 minute. Do not use CHG on your head or face. If the solution gets into your ears or eyes, rinse them well with water. Avoid your genital area. Avoid any areas of skin that have broken skin, cuts, or scrapes. Scrub your back and under your arms. Make sure to wash skin folds. Let the lather sit on your skin for 1-2 minutes or as long as told by your health care provider. Thoroughly rinse your entire body in the shower. Make sure that all body creases and crevices are rinsed well. Dry off with a clean towel. Do not put any substances on your body afterward--such as powder, lotion, or perfume--unless you are told  to do so by your health care provider. Only use lotions that are recommended by the manufacturer. Put on clean clothes or pajamas. If it is the night before your surgery, sleep in clean sheets.  During a sponge bath Follow these steps when using CHG solution during a sponge bath (unless your health care provider gives you different instructions): Use your normal soap and shampoo to wash your face and hair. Pour the CHG onto a clean washcloth. Starting at your neck, lather your body down to your toes. Make sure you follow these instructions: If you will be having surgery, pay special attention to the part of your body where you will be having surgery. Scrub this area for at least 1 minute. Do not use CHG on your head or face. If the solution gets into your ears or eyes, rinse them well with water. Avoid your genital area. Avoid any areas of skin that have broken skin, cuts, or scrapes. Scrub your back and under your arms. Make sure to wash skin folds. Let the lather sit on your skin for 1-2 minutes or as long as told by your health care provider. Using a different clean, wet washcloth, thoroughly rinse your entire body. Make sure that all body creases and crevices are rinsed well. Dry off with a clean towel. Do not put any substances on your body afterward--such as powder, lotion, or perfume--unless you are told to do so by your health care provider. Only use lotions that are recommended by the manufacturer. Put on clean clothes or pajamas. If it is the night before your surgery, sleep in clean sheets. How to use CHG prepackaged cloths Only use CHG cloths as told by your health care provider, and follow the instructions on the label. Use the CHG cloth on clean, dry skin. Do not use the CHG cloth on your head or face unless your health care provider tells you to. When washing with the CHG cloth: Avoid your genital area. Avoid any areas of skin that have broken skin, cuts, or scrapes. Before  surgery Follow these steps when using a CHG cloth to clean before surgery (unless your health care provider gives you different instructions): Using the CHG cloth, vigorously scrub the part of your body where you will be having surgery. Scrub using a back-and-forth motion for 3 minutes. The area on your body should be completely wet with CHG when you are done scrubbing. Do not rinse. Discard the cloth and let the area air-dry. Do not put any substances on the area afterward, such as powder, lotion, or perfume. Put on clean clothes or pajamas. If it is the night before your surgery, sleep in clean sheets.  For general bathing Follow these steps when using CHG cloths  for general bathing (unless your health care provider gives you different instructions). Use a separate CHG cloth for each area of your body. Make sure you wash between any folds of skin and between your fingers and toes. Wash your body in the following order, switching to a new cloth after each step: The front of your neck, shoulders, and chest. Both of your arms, under your arms, and your hands. Your stomach and groin area, avoiding the genitals. Your right leg and foot. Your left leg and foot. The back of your neck, your back, and your buttocks. Do not rinse. Discard the cloth and let the area air-dry. Do not put any substances on your body afterward--such as powder, lotion, or perfume--unless you are told to do so by your health care provider. Only use lotions that are recommended by the manufacturer. Put on clean clothes or pajamas. Contact a health care provider if: Your skin gets irritated after scrubbing. You have questions about using your solution or cloth. You swallow any chlorhexidine. Call your local poison control center (1-820 054 4948 in the [place] Get help right away if: Your eyes itch badly, or they become very red or swollen. Your skin itches badly and is red or swollen. Your hearing changes. You have trouble  seeing. You have swelling or tingling in your mouth or throat. You have trouble breathing. These symptoms may represent a serious problem that is an emergency. Do not wait to see if the symptoms will go away. Get medical help right away. Call your local emergency services (911 in the [place] Do not drive yourself to the hospital. Summary Chlorhexidine gluconate (CHG) is a germ-killing (antiseptic) solution that is used to clean the skin. Cleaning your skin with CHG may help to lower your risk for infection. You may be given CHG to use for bathing. It may be in a bottle or in a prepackaged cloth to use on your skin. Carefully follow your health care provider's instructions and the instructions on the product label. Do not use CHG if you have a chlorhexidine allergy. Contact your health care provider if your skin gets irritated after scrubbing. This information is not intended to replace advice given to you by your health care provider. Make sure you discuss any questions you have with your health care provider. Document Revised: 01/29/2021 Document Reviewed: 01/29/2021 Elsevier Patient Education  [place]. Abdominal Hysterectomy, Care After The following information offers guidance on how to care for yourself after your procedure. Your doctor may also give you more specific instructions. If you have problems or questions, contact your doctor. What can I expect after the procedure? After the procedure, it is common to have: Pain. Tiredness. No desire to eat. Less interest in sex. Bleeding and fluid (discharge) from your vagina. You may need to use a pad after this procedure. Trouble pooping (constipation). Feelings of sadness or other emotions. Follow these instructions at home: Medicines Take over-the-counter and prescription medicines only as told by your doctor. If you were prescribed an antibiotic medicine, take it as told by your doctor. Do not stop using the antibiotic even if  you start to feel better. If told, take steps to prevent problems with pooping (constipation). You may need to: Drink enough fluid to keep your pee (urine) pale yellow. Take medicines. You will be told what medicines to take. Eat foods that are high in fiber. These include beans, whole grains, and fresh fruits and vegetables. Limit foods that are high in fat and sugar. These  include fried or sweet foods. Ask your doctor if you should avoid driving or using machines while you are taking your medicine. Surgical cut care  Follow instructions from your doctor about how to take care of your cut from surgery (incision). Make sure you: Wash your hands with soap and water for at least 20 seconds before and after you change your bandage. If you cannot use soap and water, use hand sanitizer. Change your bandage. Leave stitches or skin glue in place for at least two weeks. Leave tape strips alone unless you are told to take them off. You may trim the edges of the tape strips if they curl up. Keep the bandage dry until your doctor says it can be taken off. Check your incision every day for signs of infection. Check for: More redness, swelling, or pain. Fluid or blood. Warmth. Pus or a bad smell. Activity  Rest as told by your doctor. Get up to take short walks every 1 to 2 hours. Ask for help if you feel weak or unsteady. Do not lift anything that is heavier than 10 lb (4.5 kg), or the limit that you are told. Follow your doctor's advice about exercise, driving, and general activities. Return to your normal activities when your doctor says that it is safe. Lifestyle Do not douche, use tampons, or have sex for at least 6 weeks or as told by your doctor. Do not drink alcohol until your doctor says it is okay. Do not smoke or use any products that contain nicotine or tobacco. These can delay healing after surgery. If you need help quitting, ask your doctor. General instructions Do not take baths,  swim, or use a hot tub. Ask your doctor about taking showers or sponge baths. Try to have a responsible adult at home with you for the first 1-2 weeks to help with your daily chores. Wear tight-fitting (compression) stockings as told by your doctor. Keep all follow-up visits. Contact a doctor if: You have chills or a fever. You have any of these signs of infection around your cut: More redness, swelling, or pain. Fluid or blood. Warmth. Pus or a bad smell. Your cut breaks open. You feel dizzy or light-headed. You have pain or bleeding when you pee. You keep having watery poop (diarrhea). You keep feeling like you may vomit or you keep vomiting. You have fluid coming from your vagina that is not normal. You have any type of reaction to your medicine that is not normal, like a rash, or you develop an allergy to your medicine. Your pain medicine does not help. Get help right away if: You have a fever and your symptoms get worse suddenly. You have very bad pain in your belly (abdomen). You are short of breath. You faint. You have pain, swelling, or redness of your leg. You bleed a lot from your vagina and you see blood clots. Summary It is normal to have some pain, tiredness, and fluid that comes from your vagina. Do not take baths, swim, or use a hot tub. Ask your doctor about taking showers or sponge baths. Do not lift anything that is heavier than 10 lb (4.5 kg), or the limit that you are told. Follow your doctor's advice about exercise, driving, and general activities. Try to have a responsible adult at home with you for the first 1-2 weeks to help with your daily chores. This information is not intended to replace advice given to you by your health care provider. Make sure  you discuss any questions you have with your health care provider. Document Revised: 01/26/2021 Document Reviewed: 07/20/2020 Elsevier Patient Education  [place] Anesthesia, Adult, Care  After This sheet gives you information about how to care for yourself after your procedure. Your health care provider may also give you more specific instructions. If you have problems or questions, contact your health care provider. What can I expect after the procedure? After the procedure, the following side effects are common: Pain or discomfort at the IV site. Nausea. Vomiting. Sore throat. Trouble concentrating. Feeling cold or chills. Feeling weak or tired. Sleepiness and fatigue. Soreness and body aches. These side effects can affect parts of the body that were not involved in surgery. Follow these instructions at home: For the time period you were told by your health care provider:  Rest. Do not participate in activities where you could fall or become injured. Do not drive or use machinery. Do not drink alcohol. Do not take sleeping pills or medicines that cause drowsiness. Do not make important decisions or sign legal documents. Do not take care of children on your own. Eating and drinking Follow any instructions from your health care provider about eating or drinking restrictions. When you feel hungry, start by eating small amounts of foods that are soft and easy to digest (bland), such as toast. Gradually return to your regular diet. Drink enough fluid to keep your urine pale yellow. If you vomit, rehydrate by drinking water, juice, or clear broth. General instructions If you have sleep apnea, surgery and certain medicines can increase your risk for breathing problems. Follow instructions from your health care provider about wearing your sleep device: Anytime you are sleeping, including during daytime naps. While taking prescription pain medicines, sleeping medicines, or medicines that make you drowsy. Have a responsible adult stay with you for the time you are told. It is important to have someone help care for you until you are awake and alert. Return to your normal  activities as told by your health care provider. Ask your health care provider what activities are safe for you. Take over-the-counter and prescription medicines only as told by your health care provider. If you smoke, do not smoke without supervision. Keep all follow-up visits as told by your health care provider. This is important. Contact a health care provider if: You have nausea or vomiting that does not get better with medicine. You cannot eat or drink without vomiting. You have pain that does not get better with medicine. You are unable to pass urine. You develop a skin rash. You have a fever. You have redness around your IV site that gets worse. Get help right away if: You have difficulty breathing. You have chest pain. You have blood in your urine or stool, or you vomit blood. Summary After the procedure, it is common to have a sore throat or nausea. It is also common to feel tired. Have a responsible adult stay with you for the time you are told. It is important to have someone help care for you until you are awake and alert. When you feel hungry, start by eating small amounts of foods that are soft and easy to digest (bland), such as toast. Gradually return to your regular diet. Drink enough fluid to keep your urine pale yellow. Return to your normal activities as told by your health care provider. Ask your health care provider what activities are safe for you. This information is not intended to replace  advice given to you by your health care provider. Make sure you discuss any questions you have with your health care provider. Document Revised: 08/03/2020 Document Reviewed: 03/02/2020 Elsevier Patient Education  [place].

## 2022-04-30 ENCOUNTER — Encounter (HOSPITAL_COMMUNITY): Payer: Self-pay

## 2022-04-30 ENCOUNTER — Encounter (HOSPITAL_COMMUNITY): Payer: Self-pay | Admitting: Obstetrics & Gynecology

## 2022-04-30 ENCOUNTER — Other Ambulatory Visit (HOSPITAL_COMMUNITY)
Admission: RE | Admit: 2022-04-30 | Discharge: 2022-04-30 | Disposition: A | Discharge status: Home / Self Care | Payer: 59 | Source: Ambulatory Visit | Attending: Obstetrics & Gynecology | Admitting: Obstetrics & Gynecology

## 2022-04-30 ENCOUNTER — Encounter (HOSPITAL_COMMUNITY)
Admission: RE | Admit: 2022-04-30 | Discharge: 2022-04-30 | Disposition: A | Discharge status: Home / Self Care | Payer: 59 | Source: Ambulatory Visit | Attending: Obstetrics & Gynecology | Admitting: Obstetrics & Gynecology

## 2022-04-30 ENCOUNTER — Other Ambulatory Visit: Payer: Self-pay | Admitting: Obstetrics & Gynecology

## 2022-04-30 VITALS — BP 127/82 | HR 91 | Temp 97.8°F | Resp 18 | Ht 62.0 in | Wt 212.0 lb

## 2022-04-30 DIAGNOSIS — Z01818 Encounter for other preprocedural examination: Secondary | ICD-10-CM

## 2022-04-30 LAB — URINALYSIS, ROUTINE W REFLEX MICROSCOPIC
Bilirubin Urine: NEGATIVE
Glucose, UA: NEGATIVE mg/dL
Ketones, ur: NEGATIVE mg/dL
Nitrite: NEGATIVE
Protein, ur: NEGATIVE mg/dL
Specific Gravity, Urine: 1.024 (ref 1.005–1.030)
pH: 5 (ref 5.0–8.0)

## 2022-04-30 LAB — COMPREHENSIVE METABOLIC PANEL
ALT: 20 U/L (ref 0–44)
AST: 25 U/L (ref 15–41)
Albumin: 4.1 g/dL (ref 3.5–5.0)
Alkaline Phosphatase: 73 U/L (ref 38–126)
Anion gap: 8 (ref 5–15)
BUN: 10 mg/dL (ref 6–20)
CO2: 20 mmol/L — ABNORMAL LOW (ref 22–32)
Calcium: 9.1 mg/dL (ref 8.9–10.3)
Chloride: 110 mmol/L (ref 98–111)
Creatinine, Ser: 0.82 mg/dL (ref 0.44–1.00)
GFR, Estimated: >60 mL/min (ref >60)
Glucose, Bld: 109 mg/dL — ABNORMAL HIGH (ref 70–99)
Potassium: 3.8 mmol/L (ref 3.5–5.1)
Sodium: 138 mmol/L (ref 135–145)
Total Bilirubin: 0.1 mg/dL — ABNORMAL LOW (ref 0.3–1.2)
Total Protein: 7.1 g/dL (ref 6.5–8.1)

## 2022-04-30 LAB — RAPID HIV SCREEN (HIV 1/2 AB+AG)
HIV 1/2 Antibodies: NONREACTIVE
HIV-1 P24 Antigen - HIV24: NONREACTIVE

## 2022-04-30 LAB — TYPE AND SCREEN
ABO/RH(D): O POS
Antibody Screen: NEGATIVE

## 2022-04-30 LAB — CBC
HCT: 44.6 % (ref 36.0–46.0)
Hemoglobin: 15 g/dL (ref 12.0–15.0)
MCH: 29.8 pg (ref 26.0–34.0)
MCHC: 33.6 g/dL (ref 30.0–36.0)
MCV: 88.5 fL (ref 80.0–100.0)
Platelets: 166 10*3/uL (ref 150–400)
RBC: 5.04 MIL/uL (ref 3.87–5.11)
RDW: 12.3 % (ref 11.5–15.5)
WBC: 4.5 10*3/uL (ref 4.0–10.5)
nRBC: 0 % (ref 0.0–0.2)

## 2022-04-30 LAB — POCT PREGNANCY, URINE: Preg Test, Ur: NEGATIVE

## 2022-05-01 ENCOUNTER — Other Ambulatory Visit: Payer: Self-pay

## 2022-05-01 ENCOUNTER — Ambulatory Visit (HOSPITAL_BASED_OUTPATIENT_CLINIC_OR_DEPARTMENT_OTHER): Payer: 59 | Admitting: Anesthesiology

## 2022-05-01 ENCOUNTER — Encounter (HOSPITAL_COMMUNITY): Payer: Self-pay | Admitting: Obstetrics & Gynecology

## 2022-05-01 ENCOUNTER — Ambulatory Visit (HOSPITAL_COMMUNITY): Payer: 59 | Admitting: Anesthesiology

## 2022-05-01 ENCOUNTER — Encounter (HOSPITAL_COMMUNITY)
Admission: RE | Disposition: A | Discharge status: Home / Self Care | Payer: Self-pay | Source: Home / Self Care | Attending: Obstetrics & Gynecology

## 2022-05-01 ENCOUNTER — Ambulatory Visit (HOSPITAL_COMMUNITY)
Admission: RE | Admit: 2022-05-01 | Discharge: 2022-05-01 | Disposition: A | Discharge status: Home / Self Care | Payer: 59 | Attending: Obstetrics & Gynecology | Admitting: Obstetrics & Gynecology

## 2022-05-01 DIAGNOSIS — N839 Noninflammatory disorder of ovary, fallopian tube and broad ligament, unspecified: Secondary | ICD-10-CM | POA: Insufficient documentation

## 2022-05-01 DIAGNOSIS — K219 Gastro-esophageal reflux disease without esophagitis: Secondary | ICD-10-CM | POA: Insufficient documentation

## 2022-05-01 DIAGNOSIS — D251 Intramural leiomyoma of uterus: Secondary | ICD-10-CM | POA: Insufficient documentation

## 2022-05-01 DIAGNOSIS — N736 Female pelvic peritoneal adhesions (postinfective): Secondary | ICD-10-CM | POA: Insufficient documentation

## 2022-05-01 DIAGNOSIS — N8000 Endometriosis of the uterus, unspecified: Secondary | ICD-10-CM | POA: Insufficient documentation

## 2022-05-01 DIAGNOSIS — R102 Pelvic and perineal pain unspecified side: Secondary | ICD-10-CM

## 2022-05-01 DIAGNOSIS — F419 Anxiety disorder, unspecified: Secondary | ICD-10-CM | POA: Diagnosis not present

## 2022-05-01 DIAGNOSIS — Z87891 Personal history of nicotine dependence: Secondary | ICD-10-CM

## 2022-05-01 DIAGNOSIS — Z6838 Body mass index (BMI) 38.0-38.9, adult: Secondary | ICD-10-CM | POA: Insufficient documentation

## 2022-05-01 DIAGNOSIS — I10 Essential (primary) hypertension: Secondary | ICD-10-CM

## 2022-05-01 DIAGNOSIS — N856 Intrauterine synechiae: Secondary | ICD-10-CM | POA: Diagnosis not present

## 2022-05-01 DIAGNOSIS — N941 Unspecified dyspareunia: Secondary | ICD-10-CM | POA: Diagnosis not present

## 2022-05-01 DIAGNOSIS — N83291 Other ovarian cyst, right side: Secondary | ICD-10-CM

## 2022-05-01 DIAGNOSIS — D259 Leiomyoma of uterus, unspecified: Secondary | ICD-10-CM

## 2022-05-01 DIAGNOSIS — N838 Other noninflammatory disorders of ovary, fallopian tube and broad ligament: Secondary | ICD-10-CM

## 2022-05-01 DIAGNOSIS — N72 Inflammatory disease of cervix uteri: Secondary | ICD-10-CM

## 2022-05-01 HISTORY — PX: OOPHORECTOMY: SHX6387

## 2022-05-01 HISTORY — PX: ABDOMINAL HYSTERECTOMY: SHX81

## 2022-05-01 LAB — ABO/RH: ABO/RH(D): O POS

## 2022-05-01 SURGERY — HYSTERECTOMY, ABDOMINAL
Anesthesia: General | Site: Abdomen | Laterality: Right

## 2022-05-01 MED ORDER — BUPIVACAINE-MELOXICAM ER 200-6 MG/7ML IJ SOLN
INTRAMUSCULAR | Status: DC | PRN
  Administered 2022-05-01: 200 mg

## 2022-05-01 MED ORDER — GABAPENTIN 300 MG PO CAPS: 300.0000 mg | ORAL_CAPSULE | Freq: Three times a day (TID) | ORAL | 0 refills | Status: DC

## 2022-05-01 MED ORDER — KETOROLAC TROMETHAMINE 30 MG/ML IJ SOLN
30.0000 mg | Freq: Once | INTRAMUSCULAR | Status: AC
  Administered 2022-05-01: 30 mg via INTRAVENOUS
  Filled 2022-05-01: qty 1

## 2022-05-01 MED ORDER — LACTATED RINGERS IV SOLN: INTRAVENOUS | Status: DC

## 2022-05-01 MED ORDER — ONDANSETRON HCL 4 MG/2ML IJ SOLN
INTRAMUSCULAR | Status: DC | PRN
  Administered 2022-05-01: 4 mg via INTRAVENOUS

## 2022-05-01 MED ORDER — METOCLOPRAMIDE HCL 5 MG/ML IJ SOLN
10.0000 mg | Freq: Once | INTRAMUSCULAR | Status: AC
  Administered 2022-05-01: 10 mg via INTRAVENOUS
  Filled 2022-05-01: qty 2

## 2022-05-01 MED ORDER — KETOROLAC TROMETHAMINE 10 MG PO TABS: 10.0000 mg | ORAL_TABLET | Freq: Three times a day (TID) | ORAL | 0 refills | Status: DC | PRN

## 2022-05-01 MED ORDER — LIDOCAINE HCL (PF) 2 % IJ SOLN
INTRAMUSCULAR | Status: AC
  Filled 2022-05-01: qty 5

## 2022-05-01 MED ORDER — SUGAMMADEX SODIUM 200 MG/2ML IV SOLN
INTRAVENOUS | Status: DC | PRN
  Administered 2022-05-01: 200 mg via INTRAVENOUS

## 2022-05-01 MED ORDER — OXYCODONE-ACETAMINOPHEN 7.5-325 MG PO TABS
1.0000 | ORAL_TABLET | ORAL | Status: DC | PRN
  Administered 2022-05-01: 1 via ORAL
  Filled 2022-05-01: qty 1

## 2022-05-01 MED ORDER — HEMOSTATIC AGENTS (NO CHARGE) OPTIME
TOPICAL | Status: DC | PRN
  Administered 2022-05-01: 1 via TOPICAL

## 2022-05-01 MED ORDER — PROPOFOL 10 MG/ML IV BOLUS
INTRAVENOUS | Status: AC
  Filled 2022-05-01: qty 20

## 2022-05-01 MED ORDER — KETOROLAC TROMETHAMINE 30 MG/ML IJ SOLN
30.0000 mg | Freq: Once | INTRAMUSCULAR | Status: AC
  Administered 2022-05-01: 30 mg via INTRAMUSCULAR
  Filled 2022-05-01: qty 1

## 2022-05-01 MED ORDER — PROPOFOL 10 MG/ML IV BOLUS
INTRAVENOUS | Status: DC | PRN
  Administered 2022-05-01: 200 mg via INTRAVENOUS

## 2022-05-01 MED ORDER — ORAL CARE MOUTH RINSE
15.0000 mL | Freq: Once | OROMUCOSAL | Status: AC
Start: 2022-05-01 — End: 2022-05-01

## 2022-05-01 MED ORDER — POVIDONE-IODINE 10 % EX SWAB: 2.0000 "application " | Freq: Once | CUTANEOUS | Status: DC

## 2022-05-01 MED ORDER — PROMETHAZINE HCL 25 MG PO TABS: 25.0000 mg | ORAL_TABLET | Freq: Four times a day (QID) | ORAL | 1 refills | Status: DC | PRN

## 2022-05-01 MED ORDER — FENTANYL CITRATE PF 50 MCG/ML IJ SOSY: 50.0000 ug | PREFILLED_SYRINGE | INTRAMUSCULAR | Status: DC | PRN

## 2022-05-01 MED ORDER — DEXAMETHASONE SODIUM PHOSPHATE 4 MG/ML IJ SOLN
INTRAMUSCULAR | Status: DC | PRN
  Administered 2022-05-01: 5 mg via INTRAVENOUS

## 2022-05-01 MED ORDER — HYDROMORPHONE HCL 1 MG/ML IJ SOLN
0.5000 mg | INTRAMUSCULAR | Status: DC | PRN
  Administered 2022-05-01 (×3): 0.5 mg via INTRAVENOUS
  Filled 2022-05-01 (×3): qty 0.5

## 2022-05-01 MED ORDER — OXYCODONE-ACETAMINOPHEN 7.5-325 MG PO TABS: 1.0000 | ORAL_TABLET | ORAL | 0 refills | Status: DC | PRN

## 2022-05-01 MED ORDER — ONDANSETRON HCL 8 MG PO TABS: 8.0000 mg | ORAL_TABLET | Freq: Three times a day (TID) | ORAL | 0 refills | Status: DC | PRN

## 2022-05-01 MED ORDER — MIDAZOLAM HCL 5 MG/5ML IJ SOLN
INTRAMUSCULAR | Status: DC | PRN
  Administered 2022-05-01: 2 mg via INTRAVENOUS

## 2022-05-01 MED ORDER — SODIUM CHLORIDE 0.9 % IR SOLN
Status: DC | PRN
  Administered 2022-05-01 (×2): 1000 mL

## 2022-05-01 MED ORDER — DEXAMETHASONE SODIUM PHOSPHATE 10 MG/ML IJ SOLN
INTRAMUSCULAR | Status: AC
  Filled 2022-05-01: qty 1

## 2022-05-01 MED ORDER — MIDAZOLAM HCL 2 MG/2ML IJ SOLN
INTRAMUSCULAR | Status: AC
  Filled 2022-05-01: qty 2

## 2022-05-01 MED ORDER — FENTANYL CITRATE (PF) 250 MCG/5ML IJ SOLN
INTRAMUSCULAR | Status: AC
  Filled 2022-05-01: qty 5

## 2022-05-01 MED ORDER — BUPIVACAINE-MELOXICAM ER 200-6 MG/7ML IJ SOLN
300.0000 mg | Freq: Once | INTRAMUSCULAR | Status: DC
  Filled 2022-05-01: qty 1

## 2022-05-01 MED ORDER — ONDANSETRON HCL 4 MG/2ML IJ SOLN
INTRAMUSCULAR | Status: AC
  Filled 2022-05-01: qty 2

## 2022-05-01 MED ORDER — GLYCOPYRROLATE 0.2 MG/ML IJ SOLN
INTRAMUSCULAR | Status: DC | PRN
  Administered 2022-05-01: .1 mg via INTRAVENOUS

## 2022-05-01 MED ORDER — CEFAZOLIN SODIUM-DEXTROSE 2-4 GM/100ML-% IV SOLN
2.0000 g | INTRAVENOUS | Status: AC
  Administered 2022-05-01: 2 g via INTRAVENOUS
  Filled 2022-05-01: qty 100

## 2022-05-01 MED ORDER — FENTANYL CITRATE (PF) 100 MCG/2ML IJ SOLN
INTRAMUSCULAR | Status: DC | PRN
  Administered 2022-05-01: 100 ug via INTRAVENOUS
  Administered 2022-05-01: 25 ug via INTRAVENOUS
  Administered 2022-05-01: 50 ug via INTRAVENOUS

## 2022-05-01 MED ORDER — CHLORHEXIDINE GLUCONATE 0.12 % MT SOLN
15.0000 mL | Freq: Once | OROMUCOSAL | Status: AC
Start: 2022-05-01 — End: 2022-05-01
  Administered 2022-05-01: 15 mL via OROMUCOSAL

## 2022-05-01 MED ORDER — ROCURONIUM BROMIDE 10 MG/ML (PF) SYRINGE
PREFILLED_SYRINGE | INTRAVENOUS | Status: DC | PRN
  Administered 2022-05-01: 20 mg via INTRAVENOUS
  Administered 2022-05-01: 60 mg via INTRAVENOUS

## 2022-05-01 MED ORDER — FENTANYL CITRATE PF 50 MCG/ML IJ SOSY
25.0000 ug | PREFILLED_SYRINGE | INTRAMUSCULAR | Status: DC | PRN
  Administered 2022-05-01: 50 ug via INTRAVENOUS
  Filled 2022-05-01: qty 1

## 2022-05-01 MED ORDER — ONDANSETRON HCL 4 MG/2ML IJ SOLN
4.0000 mg | Freq: Once | INTRAMUSCULAR | Status: AC | PRN
  Administered 2022-05-01: 4 mg via INTRAVENOUS
  Filled 2022-05-01: qty 2

## 2022-05-01 MED ORDER — ACETAMINOPHEN 10 MG/ML IV SOLN
1000.0000 mg | Freq: Once | INTRAVENOUS | Status: AC
  Administered 2022-05-01: 1000 mg via INTRAVENOUS
  Filled 2022-05-01: qty 100

## 2022-05-01 MED ORDER — LIDOCAINE HCL (CARDIAC) PF 100 MG/5ML IV SOSY
PREFILLED_SYRINGE | INTRAVENOUS | Status: DC | PRN
  Administered 2022-05-01: 60 mg via INTRAVENOUS

## 2022-05-01 SURGICAL SUPPLY — 51 items
ADH SKN CLS APL DERMABOND .7 (GAUZE/BANDAGES/DRESSINGS) ×2
APPLIER CLIP 13 LRG OPEN (CLIP) ×3
APR CLP LRG 13 20 CLIP (CLIP) ×2
CLIP APPLIE 13 LRG OPEN (CLIP) IMPLANT
CLOTH BEACON ORANGE TIMEOUT ST (SAFETY) ×4 IMPLANT
COVER LIGHT HANDLE STERIS (MISCELLANEOUS) ×8 IMPLANT
DERMABOND ADVANCED (GAUZE/BANDAGES/DRESSINGS) ×1
DERMABOND ADVANCED .7 DNX12 (GAUZE/BANDAGES/DRESSINGS) ×3 IMPLANT
DRAPE WARM FLUID 44X44 (DRAPES) ×4 IMPLANT
DRSG OPSITE POSTOP 4X8 (GAUZE/BANDAGES/DRESSINGS) ×4 IMPLANT
ELECT REM PT RETURN 9FT ADLT (ELECTROSURGICAL) ×3
ELECTRODE REM PT RTRN 9FT ADLT (ELECTROSURGICAL) ×3 IMPLANT
GAUZE 4X4 16PLY ~~LOC~~+RFID DBL (SPONGE) ×5 IMPLANT
GLOVE BIOGEL PI IND STRL 7.0 (GLOVE) ×6 IMPLANT
GLOVE BIOGEL PI IND STRL 8 (GLOVE) ×3 IMPLANT
GLOVE BIOGEL PI INDICATOR 7.0 (GLOVE) ×2
GLOVE BIOGEL PI INDICATOR 8 (GLOVE) ×1
GLOVE ECLIPSE 8.0 STRL XLNG CF (GLOVE) ×8 IMPLANT
GLOVE SRG 8 PF TXTR STRL LF DI (GLOVE) ×3 IMPLANT
GLOVE SURG UNDER POLY LF SZ8 (GLOVE) ×3
GOWN STRL REUS W/TWL LRG LVL3 (GOWN DISPOSABLE) ×8 IMPLANT
GOWN STRL REUS W/TWL XL LVL3 (GOWN DISPOSABLE) ×4 IMPLANT
INST SET MAJOR GENERAL (KITS) ×4 IMPLANT
KIT BLADEGUARD II DBL (SET/KITS/TRAYS/PACK) ×4 IMPLANT
KIT TURNOVER KIT A (KITS) ×4 IMPLANT
MANIFOLD NEPTUNE II (INSTRUMENTS) ×4 IMPLANT
NDL HYPO 18GX1.5 BLUNT FILL (NEEDLE) ×3 IMPLANT
NDL HYPO 21X1.5 SAFETY (NEEDLE) ×3 IMPLANT
NEEDLE HYPO 18GX1.5 BLUNT FILL (NEEDLE) ×3 IMPLANT
NEEDLE HYPO 21X1.5 SAFETY (NEEDLE) ×3 IMPLANT
NS IRRIG 1000ML POUR BTL (IV SOLUTION) ×8 IMPLANT
PACK MAJOR ABDOMINAL (CUSTOM PROCEDURE TRAY) ×4 IMPLANT
PAD ARMBOARD 7.5X6 YLW CONV (MISCELLANEOUS) ×4 IMPLANT
PENCIL SMOKE EVACUATOR (MISCELLANEOUS) ×4 IMPLANT
POWDER SURGICEL 3.0 GRAM (HEMOSTASIS) ×1 IMPLANT
RETRACTOR WOUND ALXS 18CM MED (MISCELLANEOUS) IMPLANT
RTRCTR WOUND ALEXIS O 18CM MED (MISCELLANEOUS) ×3
SET BASIN LINEN APH (SET/KITS/TRAYS/PACK) ×4 IMPLANT
SPONGE T-LAP 18X18 ~~LOC~~+RFID (SPONGE) ×9 IMPLANT
SUT CHROMIC 0 CT 1 (SUTURE) ×4 IMPLANT
SUT MON AB 3-0 SH 27 (SUTURE) ×1 IMPLANT
SUT VIC AB 0 CT1 27 (SUTURE) ×9
SUT VIC AB 0 CT1 27XCR 8 STRN (SUTURE) ×6 IMPLANT
SUT VIC AB 0 CTX 36 (SUTURE) ×3
SUT VIC AB 0 CTX36XBRD ANTBCTR (SUTURE) ×3 IMPLANT
SUT VICRYL 3 0 (SUTURE) ×4 IMPLANT
SYR 20ML LL LF (SYRINGE) ×8 IMPLANT
SYR 50ML LL SCALE MARK (SYRINGE) ×4 IMPLANT
SYR BULB IRRIG 60ML STRL (SYRINGE) ×4 IMPLANT
TOWEL ~~LOC~~+RFID 17X26 BLUE (SPONGE) ×4 IMPLANT
TRAY FOLEY MTR SLVR 16FR STAT (SET/KITS/TRAYS/PACK) ×4 IMPLANT

## 2022-05-01 NOTE — Transfer of Care (Signed)
Immediate Anesthesia Transfer of Care Note  Patient: Deborah Humphrey  Procedure(s) Performed: HYSTERECTOMY ABDOMINAL (Abdomen) OOPHORECTOMY (Right: Abdomen)  Patient Location: PACU  Anesthesia Type:General  Level of Consciousness: drowsy  Airway & Oxygen Therapy: Patient Spontanous Breathing and Patient connected to nasal cannula oxygen  Post-op Assessment: Report given to RN and Post -op Vital signs reviewed and stable  Post vital signs: Reviewed and stable  Last Vitals:  Vitals Value Taken Time  BP 122/78 05/01/22 1000  Temp    Pulse 78 05/01/22 1000  Resp 30 05/01/22 1000  SpO2 98 % 05/01/22 1000  Vitals shown include unvalidated device data.  Last Pain:  Vitals:   05/01/22 0629  TempSrc: Oral         Complications: No notable events documented.

## 2022-05-01 NOTE — Op Note (Signed)
Preoperative diagnosis:  1.  Degenerating myoma                                          2.  Pelvic pain                                         3.  Dyspareunia, bump >50%                                         4.  Complex right ovarian mass  Postoperative diagnosis:  Same as above   Procedure:  Abdominal hysterectomy + removal of right ovary  Surgeon:  Florian Buff  Assistant:    Anesthesia:  General endotracheal    Intraoperative findings: previous pelvic infection? Hemorrhagic right ovarian cyst/mass Otherwise normal findings  Description of operation:  Patient was taken to the operating room and placed in the supine position where she underwent general endotracheal anesthesia.  She was then prepped and draped in the usual sterile fashion and a Foley catheter was placed for continuous bladder drainage.  A 6 cm low transverse skin incision was made and carried down sharply to the rectus fascia which was scored in the midline and extended laterally.  The fascia was taken off the muscles superiorly and inferiorly without difficulty.  The muscles were divided.  The peritoneal cavity was entered.  An medium Alexis self-retaining retractor was placed.  The upper abdomen was packed away. Both uterine cornu were grasped with Coker clamps.  The left round ligament was suture ligated and coagulated with the electrocautery unit.  The left vesicouterine serosal flap was created.  An avascular window in in the peritoneum was created and the utero-ovarian ligament was cross clamped, cut and suture ligated.  The right round ligament was suture ligated and cut with the electrocautery unit.  The vesicouterine serosal flap on the right was created.  An avascular window in the peritoneum was created and the right utero-ovarian ligament was cross clamped, cut and double suture ligated.  Thus both ovaries were preserved at this point.  Both tubes had been previously removed.   The uterine vessels were  skeletonized bilaterally.  The uterine vessels were clamped bilaterally,  then cut and suture ligated.  Several more pedicles were taken down the cervix medial to the uterine vessels.  Each pedicle was clamped cut and suture ligated with good resulting hemostasis.  As per the preoperative plan the vagina cross clamped and the specimen was removed.  Vaginal angle suures were placed and the vagina was closed with figure of 8 interrupted sutures.  The vagina was never open, it was completely cross clamped.  The pelvis was irrigated vigorously and all pedicles were examined and found to be hemostatic. The right ovarian pedicle was cross clamped, the right ovary removed and double fore and aft suture ligated.  All specimens were sent to pathology for routine evaluation.  The Alexis self-retaining retractor was removed and the pelvis was irrigated vigorously.  All packs were removed and all counts were correct at this point x 3.  The muscles and peritoneum were reapproximated loosely.  The fascia was closed with 0 Vicryl running.  3.5 cc of  zynrelef was place under the fascia. The skin was closed using 3-0 Vicryl on a Keith needle in a subcuticular fashion.   Zynrelef 4.5 cc was placed in the SQ space.  Dermabond was then applied for additional wound integrity and to serve as a postoperative bacterial barrier.  The patient was awakened from anesthesia taken to the recovery room in good stable condition. All sponge instrument and needle counts were correct x 3.  The patient received Ancef and Toradol prophylactically preoperatively.  Estimated blood loss for the procedure was 150  cc.  Florian Buff, MD  05/01/2022 10:05 AM

## 2022-05-01 NOTE — H&P (Signed)
Preoperative History and Physical  Deborah Humphrey is a 46 y.o. G1P1001 with No LMP recorded. Patient has had an ablation. admitted for a abdominal hysterectomy with removal of right ovary.  Pt I s/p endometrial ablation and salpingectomy but has been having increasing problems with pelvic pain Sonogram reveals a degenerating myoma and complex right ovarian cyst as presumed source of pain In addition she has bump dyspareunia and as a result will have her cervix removed as well  PMH:    Past Medical History:  Diagnosis Date   Anxiety    Cervical spine fracture ([place])    Denies havign surgery    gestational hypertention    gestational hypertension   Helicobacter pylori (H. pylori)    IBS (irritable bowel syndrome)    Infertility, female     PSH:     Past Surgical History:  Procedure Laterality Date   BIOPSY  02/28/2021   Procedure: BIOPSY;  Surgeon: Harvel Quale, MD;  [facility]: [facility];  Service: Gastroenterology;;   CESAREAN SECTION     COLONOSCOPY WITH PROPOFOL N/A 02/28/2021   Procedure: COLONOSCOPY WITH PROPOFOL;  Surgeon: Harvel Quale, MD;  Location: [facility];  Service: Gastroenterology;  Laterality: N/A;  AM   DILATATION AND CURETTAGE/HYSTEROSCOPY WITH MINERVA N/A 08/22/2020   Procedure: DILATATION AND CURETTAGE/HYSTEROSCOPY WITH MINERVA;  Surgeon: Jonnie Kind, MD;  Location: [facility];  Service: Gynecology;  Laterality: N/A;   DILATION AND CURETTAGE OF UTERUS     x3   ESOPHAGOGASTRODUODENOSCOPY (EGD) WITH PROPOFOL N/A 02/28/2021   Procedure: ESOPHAGOGASTRODUODENOSCOPY (EGD) WITH PROPOFOL;  Surgeon: Harvel Quale, MD;  Location: [facility];  Service: Gastroenterology;  Laterality: N/A;   HALO APPLICATION     age 57   LAPAROSCOPIC BILATERAL SALPINGECTOMY Bilateral 08/22/2020   Procedure: LAPAROSCOPIC BILATERAL SALPINGECTOMY;  Surgeon: Jonnie Kind, MD;  Location: [facility];  Service: Gynecology;  Laterality: Bilateral;     POb/GynH:      OB History     Gravida  1   Para  1   Term  1   Preterm      AB      Living  1      SAB      IAB      Ectopic      Multiple      Live Births              SH:   Social History   Tobacco Use   Smoking status: Former    Packs/day: 1.00    Years: 15.00    Pack years: 15.00    Types: Cigarettes    Quit date: 01/31/2011    Years since quitting: 11.2   Smokeless tobacco: Never  Vaping Use   Vaping Use: Never used  Substance Use Topics   Alcohol use: Yes    Comment: occassional   Drug use: No    FH:    Family History  Problem Relation Age of Onset   Cancer Mother        skin   Cancer Father        pancreatic   Breast cancer Sister    Polycystic ovary syndrome Sister    Endometriosis Sister    Breast cancer Sister    Dementia Maternal Grandmother    Cancer Paternal Grandmother        liver   Cancer Paternal Grandfather        pancreatic?   Breast cancer Paternal Aunt  Allergies:  Allergies  Allergen Reactions   Bee Venom Swelling    Medications:       Current Facility-Administered Medications:    bupivacaine-meloxicam ER (ZYNRELEF) injection 300 mg, 300 mg, Infiltration, Once, Aneliz Carbary, Mertie Clause, MD   ceFAZolin (ANCEF) IVPB 2g/100 mL premix, 2 g, Intravenous, On Call to OR, Florian Buff, MD   povidone-iodine 10 % swab 2 application., 2 application., Topical, Once, Florian Buff, MD  Review of Systems:   Review of Systems  Constitutional: Negative for fever, chills, weight loss, malaise/fatigue and diaphoresis.  HENT: Negative for hearing loss, ear pain, nosebleeds, congestion, sore throat, neck pain, tinnitus and ear discharge.   Eyes: Negative for blurred vision, double vision, photophobia, pain, discharge and redness.  Respiratory: Negative for cough, hemoptysis, sputum production, shortness of breath, wheezing and stridor.   Cardiovascular: Negative for chest pain, palpitations, orthopnea, claudication, leg  swelling and PND.  Gastrointestinal: Positive for abdominal pain. Negative for heartburn, nausea, vomiting, diarrhea, constipation, blood in stool and melena.  Genitourinary: Negative for dysuria, urgency, frequency, hematuria and flank pain.  Musculoskeletal: Negative for myalgias, back pain, joint pain and falls.  Skin: Negative for itching and rash.  Neurological: Negative for dizziness, tingling, tremors, sensory change, speech change, focal weakness, seizures, loss of consciousness, weakness and headaches.  Endo/Heme/Allergies: Negative for environmental allergies and polydipsia. Does not bruise/bleed easily.  Psychiatric/Behavioral: Negative for depression, suicidal ideas, hallucinations, memory loss and substance abuse. The patient is not nervous/anxious and does not have insomnia.      PHYSICAL EXAM:  Pulse 79, temperature 98.3 F (36.8 C), temperature source Oral, resp. rate 18.    Vitals reviewed. Constitutional: She is oriented to person, place, and time. She appears well-developed and well-nourished.  HENT:  Head: Normocephalic and atraumatic.  Right Ear: External ear normal.  Left Ear: External ear normal.  Nose: Nose normal.  Mouth/Throat: Oropharynx is clear and moist.  Eyes: Conjunctivae and EOM are normal. Pupils are equal, round, and reactive to light. Right eye exhibits no discharge. Left eye exhibits no discharge. No scleral icterus.  Neck: Normal range of motion. Neck supple. No tracheal deviation present. No thyromegaly present.  Cardiovascular: Normal rate, regular rhythm, normal heart sounds and intact distal pulses.  Exam reveals no gallop and no friction rub.   No murmur heard. Respiratory: Effort normal and breath sounds normal. No respiratory distress. She has no wheezes. She has no rales. She exhibits no tenderness.  GI: Soft. Bowel sounds are normal. She exhibits no distension and no mass. There is tenderness. There is no rebound and no guarding.   Genitourinary:       Vulva is normal without lesions Vagina is pink moist without discharge Cervix normal in appearance and pap is normal Uterus is normal size, contour, position, consistency, mobility, non-tender Adnexa is significant for 6 cm complex right ovarian cystMusculoskeletal: Normal range of motion. She exhibits no edema and no tenderness.  Neurological: She is alert and oriented to person, place, and time. She has normal reflexes. She displays normal reflexes. No cranial nerve deficit. She exhibits normal muscle tone. Coordination normal.  Skin: Skin is warm and dry. No rash noted. No erythema. No pallor.  Psychiatric: She has a normal mood and affect. Her behavior is normal. Judgment and thought content normal.    Labs: Results for orders placed or performed during the hospital encounter of 04/30/22 (from the past 336 hour(s))  Pregnancy, urine POC   Collection Time: 04/30/22  9:40 AM  Result Value Ref Range   Preg Test, Ur NEGATIVE NEGATIVE  Results for orders placed or performed during the hospital encounter of 04/30/22 (from the past 336 hour(s))  CBC   Collection Time: 04/30/22  9:36 AM  Result Value Ref Range   WBC 4.5 4.0 - 10.5 K/uL   RBC 5.04 3.87 - 5.11 MIL/uL   Hemoglobin 15.0 12.0 - 15.0 g/dL   HCT 44.6 36.0 - 46.0 %   MCV 88.5 80.0 - 100.0 fL   MCH 29.8 26.0 - 34.0 pg   MCHC 33.6 30.0 - 36.0 g/dL   RDW 12.3 11.5 - 15.5 %   Platelets 166 150 - 400 K/uL   nRBC 0.0 0.0 - 0.2 %  Comprehensive metabolic panel   Collection Time: 04/30/22  9:36 AM  Result Value Ref Range   Sodium 138 135 - 145 mmol/L   Potassium 3.8 3.5 - 5.1 mmol/L   Chloride 110 98 - 111 mmol/L   CO2 20 (L) 22 - 32 mmol/L   Glucose, Bld 109 (H) 70 - 99 mg/dL   BUN 10 6 - 20 mg/dL   Creatinine, Ser 0.82 0.44 - 1.00 mg/dL   Calcium 9.1 8.9 - 10.3 mg/dL   Total Protein 7.1 6.5 - 8.1 g/dL   Albumin 4.1 3.5 - 5.0 g/dL   AST 25 15 - 41 U/L   ALT 20 0 - 44 U/L   Alkaline Phosphatase 73 38  - 126 U/L   Total Bilirubin 0.1 (L) 0.3 - 1.2 mg/dL   GFR, Estimated >60 >60 mL/min   Anion gap 8 5 - 15  Rapid HIV screen (HIV 1/2 Ab+Ag)   Collection Time: 04/30/22  9:36 AM  Result Value Ref Range   HIV-1 P24 Antigen - HIV24 NON REACTIVE NON REACTIVE   HIV 1/2 Antibodies NON REACTIVE NON REACTIVE   Interpretation (HIV Ag Ab)      A non reactive test result means that HIV 1 or HIV 2 antibodies and HIV 1 p24 antigen were not detected in the specimen.  Urinalysis, Routine w reflex microscopic Urine, Clean Catch   Collection Time: 04/30/22  9:36 AM  Result Value Ref Range   Color, Urine YELLOW YELLOW   APPearance HAZY (A) CLEAR   Specific Gravity, Urine 1.024 1.005 - 1.030   pH 5.0 5.0 - 8.0   Glucose, UA NEGATIVE NEGATIVE mg/dL   Hgb urine dipstick SMALL (A) NEGATIVE   Bilirubin Urine NEGATIVE NEGATIVE   Ketones, ur NEGATIVE NEGATIVE mg/dL   Protein, ur NEGATIVE NEGATIVE mg/dL   Nitrite NEGATIVE NEGATIVE   Leukocytes,Ua LARGE (A) NEGATIVE   RBC / HPF 6-10 0 - 5 RBC/hpf   WBC, UA 6-10 0 - 5 WBC/hpf   Bacteria, UA RARE (A) NONE SEEN   Squamous Epithelial / LPF 11-20 0 - 5   Budding Yeast PRESENT    Uric Acid Crys, UA PRESENT   Type and screen   Collection Time: 04/30/22  9:36 AM  Result Value Ref Range   ABO/RH(D) O POS    Antibody Screen NEG    Sample Expiration      05/03/2022,2359 Performed at [facility], [address], [place], [place] [address]   ABO/Rh   Collection Time: 05/01/22  6:45 AM  Result Value Ref Range   ABO/RH(D)      O POS Performed at [facility], [address] [place], [place] [address]     EKG: Orders placed or performed during the hospital encounter of  04/30/22   EKG 12-Lead   EKG 12-Lead    Imaging Studies: No results found.    Assessment: 1. Pelvic mass in female  R19.00      painful degenrating fibroid as source of pain, no evidence of endometriosis, recommend definitive surgical management     2. Pelvic pain  R10.2       3.  Ovarian cyst, right  N83.201      complex, remove at time of hysterectomy    Plan: TAH right oophorectomy  Pt understands the risks of surgery including but not limited t  excessive bleeding requiring transfusion or reoperation, post-operative infection requiring prolonged hospitalization or re-hospitalization and antibiotic therapy, and damage to other organs including bladder, bowel, ureters and major vessels.  The patient also understands the alternative treatment options which were discussed in full.  All questions were answered.  Florian Buff 05/01/2022 7:27 AM   Florian Buff 05/01/2022 7:19 AM

## 2022-05-01 NOTE — Anesthesia Postprocedure Evaluation (Signed)
Anesthesia Post Note  Patient: Trishna Cwik  Procedure(s) Performed: HYSTERECTOMY ABDOMINAL (Abdomen) OOPHORECTOMY (Right: Abdomen)  Patient location during evaluation: Phase II Anesthesia Type: General Level of consciousness: awake Pain management: pain level controlled Vital Signs Assessment: post-procedure vital signs reviewed and stable Respiratory status: spontaneous breathing and respiratory function stable Cardiovascular status: blood pressure returned to baseline and stable Postop Assessment: no headache and no apparent nausea or vomiting Anesthetic complications: no Comments: Late entry   No notable events documented.   Last Vitals:  Vitals:   05/01/22 1200 05/01/22 1226  BP: 126/84 140/77  Pulse: 62 61  Resp: 18 16  Temp:  36.6 C  SpO2: 99% 98%    Last Pain:  Vitals:   05/01/22 1226  TempSrc: Oral  PainSc: [place]

## 2022-05-01 NOTE — Anesthesia Preprocedure Evaluation (Signed)
Anesthesia Evaluation  Patient identified by MRN, date of birth, ID band Patient awake    Reviewed: Allergy & Precautions, H&P , NPO status , Patient's Chart, lab work & pertinent test results, reviewed documented beta blocker date and time   Airway Mallampati: II  TM Distance: >3 FB Neck ROM: full    Dental no notable dental hx.    Pulmonary neg pulmonary ROS, former smoker,    Pulmonary exam normal breath sounds clear to auscultation       Cardiovascular Exercise Tolerance: Good hypertension, negative cardio ROS   Rhythm:regular Rate:Normal     Neuro/Psych PSYCHIATRIC DISORDERS Anxiety negative neurological ROS     GI/Hepatic Neg liver ROS, GERD  Medicated,  Endo/Other  Morbid obesity  Renal/GU negative Renal ROS  negative genitourinary   Musculoskeletal   Abdominal   Peds  Hematology negative hematology ROS (+)   Anesthesia Other Findings   Reproductive/Obstetrics negative OB ROS                             Anesthesia Physical Anesthesia Plan  ASA: 3  Anesthesia Plan: General and General ETT   Post-op Pain Management:    Induction:   PONV Risk Score and Plan: Ondansetron  Airway Management Planned:   Additional Equipment:   Intra-op Plan:   Post-operative Plan:   Informed Consent: I have reviewed the patients History and Physical, chart, labs and discussed the procedure including the risks, benefits and alternatives for the proposed anesthesia with the patient or authorized representative who has indicated his/her understanding and acceptance.     Dental Advisory Given  Plan Discussed with: CRNA  Anesthesia Plan Comments:         Anesthesia Quick Evaluation

## 2022-05-01 NOTE — Anesthesia Procedure Notes (Signed)
Procedure Name: Intubation Date/Time: 05/01/2022 7:38 AM Performed by: Lieutenant Diego, CRNA Pre-anesthesia Checklist: Patient identified, Emergency Drugs available, Suction available and Patient being monitored Patient Re-evaluated:Patient Re-evaluated prior to induction Oxygen Delivery Method: Circle system utilized Preoxygenation: Pre-oxygenation with 100% oxygen Induction Type: IV induction Ventilation: Mask ventilation without difficulty Laryngoscope Size: Miller and 2 Grade View: Grade I Tube type: Oral Tube size: 7.0 mm Number of attempts: 1 Airway Equipment and Method: Stylet and Oral airway Placement Confirmation: ETT inserted through vocal cords under direct vision, positive ETCO2 and breath sounds checked- equal and bilateral Secured at: 21 cm Tube secured with: Tape Dental Injury: Teeth and Oropharynx as per pre-operative assessment

## 2022-05-02 LAB — SURGICAL PATHOLOGY

## 2022-05-03 ENCOUNTER — Encounter (HOSPITAL_COMMUNITY): Payer: Self-pay | Admitting: Obstetrics & Gynecology

## 2022-05-07 ENCOUNTER — Ambulatory Visit (INDEPENDENT_AMBULATORY_CARE_PROVIDER_SITE_OTHER): Payer: 59 | Admitting: Obstetrics & Gynecology

## 2022-05-07 ENCOUNTER — Encounter: Payer: Self-pay | Admitting: Obstetrics & Gynecology

## 2022-05-07 VITALS — BP 130/85 | HR 105 | Ht 63.0 in | Wt 210.0 lb

## 2022-05-07 DIAGNOSIS — Z90721 Acquired absence of ovaries, unilateral: Secondary | ICD-10-CM

## 2022-05-07 DIAGNOSIS — Z90710 Acquired absence of both cervix and uterus: Secondary | ICD-10-CM

## 2022-05-07 NOTE — Progress Notes (Signed)
  HPI: Patient returns for routine postoperative follow-up having undergone TAH RSO on 05/01/22.  The patient's immediate postoperative recovery has been unremarkable. Since hospital discharge the patient reports no problems usual post op stuff.   Current Outpatient Medications: amitriptyline (ELAVIL) 10 MG tablet, Take 20 mg by mouth at bedtime., Disp: , Rfl:  clidinium-chlordiazePOXIDE (LIBRAX) 5-2.5 MG capsule, Take 1 capsule by mouth 3 (three) times daily before meals., Disp: 180 capsule, Rfl: 3 gabapentin (NEURONTIN) 300 MG capsule, Take 1 capsule (300 mg total) by mouth 3 (three) times daily., Disp: 12 capsule, Rfl: 0 ketorolac (TORADOL) 10 MG tablet, Take 1 tablet (10 mg total) by mouth every 8 (eight) hours as needed., Disp: 15 tablet, Rfl: 0 L-THEANINE PO, Take 1 capsule by mouth daily., Disp: , Rfl:  Lactobacillus-Inulin (CULTURELLE ADULT ULT BALANCE PO), Take 1 capsule by mouth daily., Disp: , Rfl:  LORazepam (ATIVAN) 0.5 MG tablet, Take 0.5 mg by mouth 2 (two) times daily., Disp: , Rfl:  omeprazole (PRILOSEC) 40 MG capsule, TAKE 1 CAPSULE BY MOUTH EVERY DAY BEFORE breakfast, Disp: 90 capsule, Rfl: 3 ondansetron (ZOFRAN) 8 MG tablet, Take 1 tablet (8 mg total) by mouth every 8 (eight) hours as needed for nausea., Disp: 12 tablet, Rfl: 0 OVER THE COUNTER MEDICATION, Take 1 capsule by mouth daily. Plant Enzymes, Disp: , Rfl:  oxyCODONE-acetaminophen (PERCOCET) 7.5-325 MG tablet, Take 1-2 tablets by mouth every 4 (four) hours as needed for moderate pain., Disp: 30 tablet, Rfl: 0 promethazine (PHENERGAN) 25 MG tablet, Take 1 tablet (25 mg total) by mouth every 6 (six) hours as needed for nausea., Disp: 12 tablet, Rfl: 1 ketorolac (TORADOL) 10 MG tablet, Take 1 tablet (10 mg total) by mouth every 8 (eight) hours as needed. (Patient not taking: Reported on 05/07/2022), Disp: 15 tablet, Rfl: 0 norethindrone (ORTHO MICRONOR) 0.35 MG tablet, Take 1 tablet (0.35 mg total) by mouth daily., Disp: 30  tablet, Rfl: 2 ondansetron (ZOFRAN) 4 MG tablet, TAKE 1 TABLET BY MOUTH EVERY 8 HOURS AS NEEDED FOR NAUSEA AND VOMITING (Patient not taking: Reported on 05/07/2022), Disp: 30 tablet, Rfl: 1  No current facility-administered medications for this visit.    Blood pressure 130/85, pulse (!) 105, height '5\' 3"'$  (1.6 m), weight 210 lb (95.3 kg), last menstrual period 07/28/2020.  Physical Exam: Incision clean dry intact Abdomen soft non tender  Diagnostic Tests:   Pathology: benign  Impression + Management plan:   ICD-10-CM   1. S/P hysterectomy with oophorectomy  Z90.710    Z90.721          Medications Prescribed this encounter: No orders of the defined types were placed in this encounter.     Follow up: Return in about 1 month (around 06/06/2022) for Post Op, Follow up, with Dr Elonda Husky.    Florian Buff, MD Attending Physician for the [facility] 05/07/2022 10:24 AM

## 2022-05-09 ENCOUNTER — Encounter: Payer: Self-pay | Admitting: Obstetrics & Gynecology

## 2022-05-16 ENCOUNTER — Telehealth: Payer: Self-pay | Admitting: *Deleted

## 2022-05-16 NOTE — Telephone Encounter (Signed)
Patient states she woke up this morning with left lower dull abdominal dull pain 5/10 along with light pink bleeding with tissue. Still having extremely painful bowel movement despite taking a daily stool sofner and milk of mag.  Discussed with Dr Elonda Husky and patient advised to use an enema and dulcolax suppository. Advised if pain worsened to let us know.

## 2022-05-27 ENCOUNTER — Encounter (INDEPENDENT_AMBULATORY_CARE_PROVIDER_SITE_OTHER): Payer: Self-pay | Admitting: Gastroenterology

## 2022-05-27 ENCOUNTER — Other Ambulatory Visit (INDEPENDENT_AMBULATORY_CARE_PROVIDER_SITE_OTHER): Payer: Self-pay | Admitting: Gastroenterology

## 2022-05-27 ENCOUNTER — Encounter: Payer: Self-pay | Admitting: Obstetrics & Gynecology

## 2022-05-27 DIAGNOSIS — K581 Irritable bowel syndrome with constipation: Secondary | ICD-10-CM

## 2022-05-27 MED ORDER — LINACLOTIDE 145 MCG PO CAPS: 145.0000 ug | ORAL_CAPSULE | Freq: Every day | ORAL | 3 refills | Status: DC

## 2022-05-28 ENCOUNTER — Other Ambulatory Visit (INDEPENDENT_AMBULATORY_CARE_PROVIDER_SITE_OTHER): Payer: Self-pay | Admitting: *Deleted

## 2022-05-28 ENCOUNTER — Other Ambulatory Visit (INDEPENDENT_AMBULATORY_CARE_PROVIDER_SITE_OTHER): Payer: Self-pay | Admitting: Gastroenterology

## 2022-05-28 ENCOUNTER — Telehealth (INDEPENDENT_AMBULATORY_CARE_PROVIDER_SITE_OTHER): Payer: Self-pay | Admitting: *Deleted

## 2022-05-28 DIAGNOSIS — K581 Irritable bowel syndrome with constipation: Secondary | ICD-10-CM

## 2022-05-28 MED ORDER — TRULANCE 3 MG PO TABS: 1.0000 | ORAL_TABLET | Freq: Every day | ORAL | 3 refills | Status: DC

## 2022-05-28 NOTE — Telephone Encounter (Signed)
Fax from [employer]. Trulance 3mg  approved from 05/28/22 - 05/29/23. I called and let patient know and faxed approval letter to [facility].

## 2022-05-28 NOTE — Telephone Encounter (Signed)
She also needs a follow up appointment in the office

## 2022-06-03 ENCOUNTER — Encounter (INDEPENDENT_AMBULATORY_CARE_PROVIDER_SITE_OTHER): Payer: Self-pay | Admitting: Gastroenterology

## 2022-06-03 ENCOUNTER — Ambulatory Visit (INDEPENDENT_AMBULATORY_CARE_PROVIDER_SITE_OTHER): Payer: 59 | Admitting: Gastroenterology

## 2022-06-03 VITALS — BP 124/83 | HR 108 | Temp 98.0°F | Ht 63.0 in | Wt 205.7 lb

## 2022-06-03 DIAGNOSIS — K219 Gastro-esophageal reflux disease without esophagitis: Secondary | ICD-10-CM | POA: Diagnosis not present

## 2022-06-03 DIAGNOSIS — K582 Mixed irritable bowel syndrome: Secondary | ICD-10-CM | POA: Diagnosis not present

## 2022-06-03 DIAGNOSIS — K2270 Barrett's esophagus without dysplasia: Secondary | ICD-10-CM | POA: Diagnosis not present

## 2022-06-03 NOTE — Progress Notes (Signed)
[name], M.D. Gastroenterology & Hepatology [facility] [address] [place], [place] [address]  Primary Care Physician: Lavella Lemons, [place] [id number]  I will communicate my assessment and recommendations to the referring MD via EMR.  Problems:  IBS-D GERD Barrett's esophagus  History of Present Illness: Deborah Humphrey is a 46 y.o. 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  The patient was last seen while hospitalized at [facility] in March 2023.  The patient had been previously taking Elavil and Librax without much improvement.  She was found to have small ascites and loculated fluid in the uterine fundus possibly related to an abscess.  However, after review of the CT scan, it was considered that the fluid was surrounding her uterus.  GYN was consulted and patient was started on oral contraceptive she was later seen by Dr. Elonda Husky who performed a total abdominal hysterectomy and removal of right ovary.  Patient reports that after she underwent her surgery, she reported having persistent pain in the LLQ. She describes the pain as 1-2/10 in severity, which only exacerbated once a couple of weeks ago. She has also noticed she has felt more constipated since her surgery, feels that she had to move  her bowels but unable to empty them completely. However, she started moving her bowels a couple of days ago after starting Trulance 2 days ago. Occasionally has bloating. Overall feels her abdominal pain is much less severe compared to prior.  Has not felt too much heartburn but very occasional episodes of heartburn. Currently on omeprazole 40 mg qday.  The patient denies having any nausea, vomiting, fever, chills, hematochezia, melena, hematemesis, diarrhea, jaundice, pruritus. Has lost some pounds  recently.  Last EGD: 02/28/2021, 3 tongues of salmon-colored mucosa suggestive of Barrett's esophagus extending for 2 cm, confirmed with biopsies.  Normal stomach and small bowel. With normal biopsies of the small bowel.   Last Colonoscopy: 02/28/2021, normal colon with neg biopsies for microscopic colitis.  Past Medical History: Past Medical History:  Diagnosis Date   Anxiety    Cervical spine fracture (HCC)    Denies havign surgery    gestational hypertention    gestational hypertension   Helicobacter pylori (H. pylori)    IBS (irritable bowel syndrome)    Infertility, female     Past Surgical History: Past Surgical History:  Procedure Laterality Date   ABDOMINAL HYSTERECTOMY N/A 05/01/2022   Procedure: HYSTERECTOMY ABDOMINAL;  Surgeon: Florian Buff, MD;  Location: [facility];  Service: Gynecology;  Laterality: N/A;   BIOPSY  02/28/2021   Procedure: BIOPSY;  Surgeon: Harvel Quale, MD;  Location: [facility];  Service: Gastroenterology;;   CESAREAN SECTION     COLONOSCOPY WITH PROPOFOL N/A 02/28/2021   Procedure: COLONOSCOPY WITH PROPOFOL;  Surgeon: Harvel Quale, MD;  Location: [facility];  Service: Gastroenterology;  Laterality: N/A;  AM   DILATATION AND CURETTAGE/HYSTEROSCOPY WITH MINERVA N/A 08/22/2020   Procedure: DILATATION AND CURETTAGE/HYSTEROSCOPY WITH MINERVA;  Surgeon: Jonnie Kind, MD;  Location: [facility];  Service: Gynecology;  Laterality: N/A;   DILATION AND CURETTAGE OF UTERUS     x3   ESOPHAGOGASTRODUODENOSCOPY (EGD) WITH PROPOFOL N/A 02/28/2021   Procedure: ESOPHAGOGASTRODUODENOSCOPY (EGD) WITH PROPOFOL;  Surgeon: Harvel Quale, MD;  [facility];  Service: Gastroenterology;  Laterality: N/A;   HALO APPLICATION     age 36   LAPAROSCOPIC BILATERAL SALPINGECTOMY Bilateral 08/22/2020   Procedure: LAPAROSCOPIC BILATERAL SALPINGECTOMY;  Surgeon: Jonnie Kind, MD;  Location: [facility];  Service: Gynecology;  Laterality:  Bilateral;   OOPHORECTOMY Right 05/01/2022   Procedure: OOPHORECTOMY;  Surgeon: Florian Buff, MD;  Location: [facility];  Service: Gynecology;  Laterality: Right;    Family History: Family History  Problem Relation Age of Onset   Cancer Mother        skin   Cancer Father        pancreatic   Breast cancer Sister    Polycystic ovary syndrome Sister    Endometriosis Sister    Breast cancer Sister    Dementia Maternal Grandmother    Cancer Paternal Grandmother        liver   Cancer Paternal Grandfather        pancreatic?   Breast cancer Paternal Aunt     Social History: Social History   Tobacco Use  Smoking Status Former   Packs/day: 1.00   Years: 15.00   Total pack years: 15.00   Types: Cigarettes   Quit date: 01/31/2011   Years since quitting: 11.3  Smokeless Tobacco Never   Social History   Substance and Sexual Activity  Alcohol Use Yes   Comment: occassional   Social History   Substance and Sexual Activity  Drug Use No    Allergies: Allergies  Allergen Reactions   Bee Venom Swelling    Medications: Current Outpatient Medications  Medication Sig Dispense Refill   amitriptyline (ELAVIL) 10 MG tablet Take 10 mg by mouth at bedtime.     ketorolac (TORADOL) 10 MG tablet Take 1 tablet (10 mg total) by mouth every 8 (eight) hours as needed. 15 tablet 0   L-THEANINE PO Take 1 capsule by mouth daily.     Lactobacillus-Inulin (CULTURELLE ADULT ULT BALANCE PO) Take 1 capsule by mouth daily.     LORazepam (ATIVAN) 0.5 MG tablet Take 0.5 mg by mouth 2 (two) times daily.     omeprazole (PRILOSEC) 40 MG capsule TAKE 1 CAPSULE BY MOUTH EVERY DAY BEFORE breakfast 90 capsule 3   ondansetron (ZOFRAN) 8 MG tablet Take 1 tablet (8 mg total) by mouth every 8 (eight) hours as needed for nausea. 12 tablet 0   OVER THE COUNTER MEDICATION Take 1 capsule by mouth daily. Plant Enzymes     Plecanatide (TRULANCE) 3 MG TABS Take 1 tablet by mouth daily. 90 tablet 3   promethazine  (PHENERGAN) 25 MG tablet Take 1 tablet (25 mg total) by mouth every 6 (six) hours as needed for nausea. 12 tablet 1   No current facility-administered medications for this visit.    Review of Systems: GENERAL: negative for malaise, night sweats HEENT: No changes in hearing or vision, no nose bleeds or other nasal problems. NECK: Negative for lumps, goiter, pain and significant neck swelling RESPIRATORY: Negative for cough, wheezing CARDIOVASCULAR: Negative for chest pain, leg swelling, palpitations, orthopnea GI: SEE HPI MUSCULOSKELETAL: Negative for joint pain or swelling, back pain, and muscle pain. SKIN: Negative for lesions, rash PSYCH: Negative for sleep disturbance, mood disorder and recent psychosocial stressors. HEMATOLOGY Negative for prolonged bleeding, bruising easily, and swollen nodes. ENDOCRINE: Negative for cold or heat intolerance, polyuria, polydipsia and goiter. NEURO: negative for tremor, gait imbalance, syncope and seizures. The remainder of the review of systems is noncontributory.   Physical Exam: BP 124/83 (BP Location: Left  Arm, Patient Position: Sitting, Cuff Size: Large)   Pulse (!) 108   Temp 98 F (36.7 C) (Oral)   Ht '5\' 3"'$  (1.6 m)   Wt 205 lb 11.2 oz (93.3 kg)   LMP 07/28/2020 (Exact Date)   BMI 36.44 kg/m  GENERAL: The patient is AO x3, in no acute distress. HEENT: Head is normocephalic and atraumatic. EOMI are intact. Mouth is well hydrated and without lesions. NECK: Supple. No masses LUNGS: Clear to auscultation. No presence of rhonchi/wheezing/rales. Adequate chest expansion HEART: RRR, normal s1 and s2. ABDOMEN: Soft, nontender, no guarding, no peritoneal signs, and nondistended. BS +. No masses. EXTREMITIES: Without any cyanosis, clubbing, rash, lesions or edema. NEUROLOGIC: AOx3, no focal motor deficit. SKIN: no jaundice, no rashes  Imaging/Labs: as above  I personally reviewed and interpreted the available labs, imaging and endoscopic  files.  Impression and Plan: Deborah Humphrey is a 46 y.o. 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   Regarding her abdominal pain, it has improved after her recent hysterectomy but she is still presenting pain the LLQ. This is better compared to prior but not completely resolved. It is possible part of her pain is related to her IBS, which has worsened  with the presence of constipation. She recently started Trulance, which can potentially help with both problems. I also advised her to restart taking Librax as needed and see if this helps her symptoms (she still has some pills at home).  Regarding her GERD and BE, she has been well controlled on omeprazole. The patient and I held a thorough discussion about potential nonpharmacologic treatments for reflux which include Transoral Incisionless Fundoplication (TIF) and Nissen fundoplication.  The benefits and risks, as well as prognosis with the use of the different modalities was thoroughly discussed with the patient who understood and agreed.  The patient will read more about these procedures and will let me know if interested to pursue this in the future. She is interested in TIF but needs to lose some weight to achieve a BMI of 35 or less. She will work on this and will notify us to proceed with the workup for this procedure. For now she will continue omeprazole.  - Continue omeprazole 40 mg qday - Restart Librax as needed for abdominal pain - Continue Trulance 3 mg qday - Read more about TIF - patient to reach [place] once weight decreaases to 200 lb  All questions were answered.      Harvel Quale, MD Gastroenterology and Hepatology [facility]

## 2022-06-03 NOTE — Patient Instructions (Addendum)
Continue omeprazole 40 mg qday Restart Librax as needed for abdominal pain Continue Trulance 3 mg qday Read more about TIF - pleas reach [place] once your weight decreaases to 200 lb if interested in TIF

## 2022-06-06 ENCOUNTER — Encounter: Payer: Self-pay | Admitting: Obstetrics & Gynecology

## 2022-06-06 ENCOUNTER — Ambulatory Visit (INDEPENDENT_AMBULATORY_CARE_PROVIDER_SITE_OTHER): Payer: 59 | Admitting: Obstetrics & Gynecology

## 2022-06-06 VITALS — BP 123/83 | HR 79 | Wt 206.0 lb

## 2022-06-06 DIAGNOSIS — Z90710 Acquired absence of both cervix and uterus: Secondary | ICD-10-CM

## 2022-06-06 DIAGNOSIS — Z90721 Acquired absence of ovaries, unilateral: Secondary | ICD-10-CM

## 2022-06-06 MED ORDER — TERCONAZOLE 0.4 % VA CREA: 1.0000 | TOPICAL_CREAM | Freq: Every day | VAGINAL | 0 refills | Status: DC

## 2022-06-06 NOTE — Progress Notes (Signed)
  HPI: Patient returns for routine postoperative follow-up having undergone TAH RSO on 05/01/22.  The patient's immediate postoperative recovery has been unremarkable. Since hospital discharge the patient reports yeast issues constipation.   Current Outpatient Medications: L-THEANINE PO, Take 1 capsule by mouth daily., Disp: , Rfl:  Lactobacillus-Inulin (CULTURELLE ADULT ULT BALANCE PO), Take 1 capsule by mouth daily., Disp: , Rfl:  LORazepam (ATIVAN) 0.5 MG tablet, Take 0.5 mg by mouth 2 (two) times daily., Disp: , Rfl:  omeprazole (PRILOSEC) 40 MG capsule, TAKE 1 CAPSULE BY MOUTH EVERY DAY BEFORE breakfast, Disp: 90 capsule, Rfl: 3 ondansetron (ZOFRAN) 8 MG tablet, Take 1 tablet (8 mg total) by mouth every 8 (eight) hours as needed for nausea., Disp: 12 tablet, Rfl: 0 terconazole (TERAZOL 7) 0.4 % vaginal cream, Place 1 applicator vaginally at bedtime., Disp: 45 g, Rfl: 0 ketorolac (TORADOL) 10 MG tablet, Take 1 tablet (10 mg total) by mouth every 8 (eight) hours as needed. (Patient not taking: Reported on 06/06/2022), Disp: 15 tablet, Rfl: 0 OVER THE COUNTER MEDICATION, Take 1 capsule by mouth daily. Plant Enzymes (Patient not taking: Reported on 06/06/2022), Disp: , Rfl:  Plecanatide (TRULANCE) 3 MG TABS, Take 1 tablet by mouth daily. (Patient not taking: Reported on 06/06/2022), Disp: 90 tablet, Rfl: 3 promethazine (PHENERGAN) 25 MG tablet, Take 1 tablet (25 mg total) by mouth every 6 (six) hours as needed for nausea. (Patient not taking: Reported on 06/06/2022), Disp: 12 tablet, Rfl: 1  No current facility-administered medications for this visit.    Blood pressure 123/83, pulse 79, weight 206 lb (93.4 kg), last menstrual period 07/28/2020.  Physical Exam: Incision with a little yeast treated with gentian violet Vagina + yeast Cuff intact Bimanual normal  Diagnostic Tests:   Pathology: benign  Impression + Management plan: S/P TAH RSO    Medications Prescribed this  encounter: Orders Placed This Encounter     terconazole (TERAZOL 7) 0.4 % vaginal cream         Sig: Place 1 applicator vaginally at bedtime.         Dispense:  45 g         Refill:  0     Follow up: prn   Florian Buff, MD Attending Physician for the [facility] 06/06/2022 4:25 PM

## 2022-08-21 DIAGNOSIS — R69 Illness, unspecified: Secondary | ICD-10-CM | POA: Diagnosis not present

## 2022-08-21 DIAGNOSIS — F329 Major depressive disorder, single episode, unspecified: Secondary | ICD-10-CM | POA: Diagnosis not present

## 2022-08-26 ENCOUNTER — Telehealth (INDEPENDENT_AMBULATORY_CARE_PROVIDER_SITE_OTHER): Payer: Self-pay

## 2022-08-26 NOTE — Telephone Encounter (Signed)
[facility] Notice Date 08/23/2022 [name] [address] [place], [place] [address] Prescriber: [name] Re: Marzetta Board Santizo DOB: 12-22-1975 Notice of adverse decision [employer], the utilization review entity for [facility] HMO, received a request for coverage of TRULANCE '3MG'$  TAB for you. We've denied the request for the following reason(s): Coverage for this medication is denied for the following reason(s). We reviewed the information we received about your condition and circumstances. We used the plan approve criteria when making this decision. The policy states that this medication may be approved when: -The member has a clinical condition or needs a specific dosage form for which there is no alternative on the formulary OR -The listed formulary alternatives are not recommended based on published guidelines or clinical literature OR -The formulary alternatives will likely be ineffective or less effective for the member OR -The formulary alternatives will likely cause an adverse effect OR -The member is unable to take the required number of formulary alternatives for the given diagnosis due to a trial and inadequate treatment response or contraindication OR -The member has tried and failed the required number of formulary alternatives. Based on the policy and the information we have, your request is denied. We did not receive any documentation that you meet any of the criteria outlined above. Formulary alternative(s) are lubiprostone. Requirement: 3 in a class with 3 or more alternatives, 2 in a class with 2 alternatives, or 1 in a class with only 1 alternative. Please refer to your plan documents for a complete list of alternatives. Note: Formulary alternatives may require a prior authorization. Your prescriber will be responsible for determining what alternative is appropriate for you. An explanation of the full  clinical review criteria used in our decision is included in this notice. Information about coverage denials We based this coverage denial on the terms of your benefit plan. Your plan does not cover services that are not medically necessary or that are contractually excluded. For a full explanation of the coverage

## 2022-09-05 NOTE — Telephone Encounter (Signed)
Please let the patient know that the Trulance was not covered per insurance. May need to discuss other treatments if not covered or if the patient is not able to afford medication.

## 2022-09-06 NOTE — Telephone Encounter (Signed)
I spoke with the patient and advised we can send her patient assistant application to [facility] for the Trulance. I advised she would need to fill out her portion and get the receipt from the pharmacy with the cost out of pocket to her, bring all this in to the office with proof of income and we would try to get her some assistance with the cost of the medication. I have mailed the application to the patient and will await her to bring back the forms.

## 2022-09-27 ENCOUNTER — Encounter (INDEPENDENT_AMBULATORY_CARE_PROVIDER_SITE_OTHER): Payer: Self-pay

## 2022-10-07 ENCOUNTER — Encounter (INDEPENDENT_AMBULATORY_CARE_PROVIDER_SITE_OTHER): Payer: Self-pay | Admitting: Gastroenterology

## 2022-10-07 ENCOUNTER — Ambulatory Visit (INDEPENDENT_AMBULATORY_CARE_PROVIDER_SITE_OTHER): Payer: Self-pay | Admitting: Gastroenterology

## 2022-10-07 VITALS — BP 127/88 | HR 83 | Temp 97.5°F | Ht 63.0 in | Wt 210.5 lb

## 2022-10-07 DIAGNOSIS — K582 Mixed irritable bowel syndrome: Secondary | ICD-10-CM

## 2022-10-07 DIAGNOSIS — K2270 Barrett's esophagus without dysplasia: Secondary | ICD-10-CM

## 2022-10-07 DIAGNOSIS — K219 Gastro-esophageal reflux disease without esophagitis: Secondary | ICD-10-CM

## 2022-10-07 DIAGNOSIS — R1310 Dysphagia, unspecified: Secondary | ICD-10-CM

## 2022-10-07 NOTE — Patient Instructions (Addendum)
Continue Trulance 3 mg qday Continue omeprazole 40 mg qday Continue with exercise and dietary changes to achieve weight loss Please reach [place] back if worsening problems with swallowing, may need referral for esophageal manometry

## 2022-10-07 NOTE — Progress Notes (Signed)
[name], M.D. Gastroenterology & Hepatology [facility] [address] [place], [place] [address]  Primary Care Physician: Lavella Lemons, [place] [address]  I will communicate my assessment and recommendations to the referring MD via EMR.  Problems:  IBS-D GERD Barrett's esophagus  History of Present Illness: Deborah Humphrey is a 46 y.o. 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   The patient was last seen on 06/03/2022. At that time, the was restarted on Librax as needed for abdominal pain and was continued on Trulance 3 mg every day.  Was also continue on omeprazole 40 mg for GERD and Barrett's esophagus.  Notably, her insurance stated it did not cover Trulance under her plan.  The patient applied for patient assistance for coverage for 1 year.  Patient reports that her abdominal pain has been controlled with the use of Trulance. Has a BM almost every day, which actually is better when she takes coffee.  She has noticed some "gag reflex" and dry heaving occasionally when dry heaving. Very rarely takes Zofran as she does not have too much nausea.  Very occasionally feels like the food gets stuck in her throat. Has happened 3-4 times since the last time she was seen in our office.  She states she "stretches her neck" so food can go down, but has not had to vomit.  The patient denies having any nausea, vomiting, fever, chills, hematochezia, melena, hematemesis, diarrhea, jaundice, pruritus or weight loss.  Last EGD: 02/28/2021, 3 tongues of salmon-colored mucosa suggestive of Barrett's esophagus extending for 2 cm, confirmed with biopsies.  Normal stomach and small bowel. With normal biopsies of the small bowel.   Last Colonoscopy: 02/28/2021, normal colon with neg biopsies for microscopic colitis.  Past  Medical History: Past Medical History:  Diagnosis Date   Anxiety    Cervical spine fracture (HCC)    Denies havign surgery    gestational hypertention    gestational hypertension   Helicobacter pylori (H. pylori)    IBS (irritable bowel syndrome)    Infertility, female     Past Surgical History: Past Surgical History:  Procedure Laterality Date   ABDOMINAL HYSTERECTOMY N/A 05/01/2022   Procedure: HYSTERECTOMY ABDOMINAL;  Surgeon: Florian Buff, MD;  Location: [facility];  Service: Gynecology;  Laterality: N/A;   BIOPSY  02/28/2021   Procedure: BIOPSY;  Surgeon: Harvel Quale, MD;  Location: [facility];  Service: Gastroenterology;;   CESAREAN SECTION     COLONOSCOPY WITH PROPOFOL N/A 02/28/2021   Procedure: COLONOSCOPY WITH PROPOFOL;  Surgeon: Harvel Quale, MD;  Location: [facility];  Service: Gastroenterology;  Laterality: N/A;  AM   DILATATION AND CURETTAGE/HYSTEROSCOPY WITH MINERVA N/A 08/22/2020   Procedure: DILATATION AND CURETTAGE/HYSTEROSCOPY WITH MINERVA;  Surgeon: Jonnie Kind, MD;  Location: [facility];  Service: Gynecology;  Laterality: N/A;   DILATION AND CURETTAGE OF UTERUS     x3   ESOPHAGOGASTRODUODENOSCOPY (EGD) WITH PROPOFOL N/A 02/28/2021   Procedure: ESOPHAGOGASTRODUODENOSCOPY (EGD) WITH PROPOFOL;  Surgeon: Harvel Quale, MD;  [facility]: [facility];  Service: Gastroenterology;  Laterality: N/A;   HALO APPLICATION     age 18   LAPAROSCOPIC BILATERAL SALPINGECTOMY Bilateral 08/22/2020   Procedure: LAPAROSCOPIC BILATERAL SALPINGECTOMY;  Surgeon: Jonnie Kind, MD;  Location: [facility];  Service: Gynecology;  Laterality: Bilateral;  OOPHORECTOMY Right 05/01/2022   Procedure: OOPHORECTOMY;  Surgeon: Florian Buff, MD;  Location: [facility];  Service: Gynecology;  Laterality: Right;    Family History: Family History  Problem Relation Age of Onset   Cancer Mother        skin   Cancer Father        pancreatic   Breast cancer  Sister    Polycystic ovary syndrome Sister    Endometriosis Sister    Breast cancer Sister    Dementia Maternal Grandmother    Cancer Paternal Grandmother        liver   Cancer Paternal Grandfather        pancreatic?   Breast cancer Paternal Aunt     Social History: Social History   Tobacco Use  Smoking Status Former   Packs/day: 1.00   Years: 15.00   Total pack years: 15.00   Types: Cigarettes   Quit date: 01/31/2011   Years since quitting: 11.6  Smokeless Tobacco Never   Social History   Substance and Sexual Activity  Alcohol Use Yes   Comment: occassional   Social History   Substance and Sexual Activity  Drug Use No    Allergies: Allergies  Allergen Reactions   Bee Venom Swelling    Medications: Current Outpatient Medications  Medication Sig Dispense Refill   L-THEANINE PO Take 1 capsule by mouth daily.     Lactobacillus-Inulin (CULTURELLE ADULT ULT BALANCE PO) Take 1 capsule by mouth daily.     LORazepam (ATIVAN) 0.5 MG tablet Take 0.5 mg by mouth daily at 6 (six) AM.     omeprazole (PRILOSEC) 40 MG capsule TAKE 1 CAPSULE BY MOUTH EVERY DAY BEFORE breakfast 90 capsule 3   ondansetron (ZOFRAN) 8 MG tablet Take 1 tablet (8 mg total) by mouth every 8 (eight) hours as needed for nausea. 12 tablet 0   OVER THE COUNTER MEDICATION Take 1 capsule by mouth daily. Plant Enzymes     Plecanatide (TRULANCE) 3 MG TABS Take 1 tablet by mouth daily. 90 tablet 3   promethazine (PHENERGAN) 25 MG tablet Take 1 tablet (25 mg total) by mouth every 6 (six) hours as needed for nausea. 12 tablet 1   No current facility-administered medications for this visit.    Review of Systems: GENERAL: negative for malaise, night sweats HEENT: No changes in hearing or vision, no nose bleeds or other nasal problems. NECK: Negative for lumps, goiter, pain and significant neck swelling RESPIRATORY: Negative for cough, wheezing CARDIOVASCULAR: Negative for chest pain, leg swelling,  palpitations, orthopnea GI: SEE HPI MUSCULOSKELETAL: Negative for joint pain or swelling, back pain, and muscle pain. SKIN: Negative for lesions, rash PSYCH: Negative for sleep disturbance, mood disorder and recent psychosocial stressors. HEMATOLOGY Negative for prolonged bleeding, bruising easily, and swollen nodes. ENDOCRINE: Negative for cold or heat intolerance, polyuria, polydipsia and goiter. NEURO: negative for tremor, gait imbalance, syncope and seizures. The remainder of the review of systems is noncontributory.   Physical Exam: BP 127/88 (BP Location: Left Arm, Patient Position: Sitting, Cuff Size: Large)   Pulse 83   Temp (!) 97.5 F (36.4 C) (Temporal)   Ht '5\' 3"'$  (1.6 m)   Wt 210 lb 8 oz (95.5 kg)   LMP 07/28/2020 (Exact Date)   BMI 37.29 kg/m  GENERAL: The patient is AO x3, in no acute distress. HEENT: Head is normocephalic and atraumatic. EOMI are intact. Mouth is well hydrated and without lesions. NECK: Supple. No masses LUNGS: Clear to auscultation.  No presence of rhonchi/wheezing/rales. Adequate chest expansion HEART: RRR, normal s1 and s2. ABDOMEN: mildly tender in periumbilical area, no guarding, no peritoneal signs, and nondistended. BS +. No masses. EXTREMITIES: Without any cyanosis, clubbing, rash, lesions or edema. NEUROLOGIC: AOx3, no focal motor deficit. SKIN: no jaundice, no rashes  Imaging/Labs: as above  I personally reviewed and interpreted the available labs, imaging and endoscopic files.  Impression and Plan: Shala Baumbach is a 46 y.o. 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  The patient has presented significant improvement with the use of Trulance as her abdominal pain and bowel movements have regularized.  She should continue with the same agent to manage her IBS.  Given her history of GERD and Barrett's esophagus without dysplasia, she  must continue using omeprazole 40 mg every day.  We discussed again TIF as the patient is very interested in this.  Emphasized importance of losing weight so she can become a candidate for this.  We also discussed that there are no studies evaluating the long-term impact of TIF in patients that have Barrett's esophagus.  I discussed that even if she undergoes the procedure, she may need to take low-dose PPI until there are studies evaluating this.  She stated that she may be interested in this.  Finally, she is presenting very seldom episodes of dysphagia without red flag signs.  She had an EGD a year ago that did not show any luminal abnormalities.  If her symptoms persist may consider doing an esophagram but if they worsen she may need to undergo an esophageal manometry.  - Continue Trulance 3 mg qday - Continue omeprazole 40 mg qday - Continue with exercise and dietary changes to achieve weight loss -Patient will reach [place] back if worsening problems with swallowing, may need referral for esophageal manometry  All questions were answered.      [name], MD Gastroenterology and Hepatology [facility]

## 2022-10-08 DIAGNOSIS — F329 Major depressive disorder, single episode, unspecified: Secondary | ICD-10-CM | POA: Diagnosis not present

## 2022-10-08 DIAGNOSIS — R69 Illness, unspecified: Secondary | ICD-10-CM | POA: Diagnosis not present

## 2022-11-05 DIAGNOSIS — R69 Illness, unspecified: Secondary | ICD-10-CM | POA: Diagnosis not present

## 2022-11-05 DIAGNOSIS — F329 Major depressive disorder, single episode, unspecified: Secondary | ICD-10-CM | POA: Diagnosis not present

## 2022-12-04 DIAGNOSIS — R69 Illness, unspecified: Secondary | ICD-10-CM | POA: Diagnosis not present

## 2022-12-04 DIAGNOSIS — F329 Major depressive disorder, single episode, unspecified: Secondary | ICD-10-CM | POA: Diagnosis not present

## 2022-12-04 DIAGNOSIS — F429 Obsessive-compulsive disorder, unspecified: Secondary | ICD-10-CM | POA: Diagnosis not present

## 2023-01-13 ENCOUNTER — Telehealth (INDEPENDENT_AMBULATORY_CARE_PROVIDER_SITE_OTHER): Payer: Self-pay

## 2023-01-13 NOTE — Telephone Encounter (Signed)
Omeprazole 40 mg approved per insurance.

## 2023-01-15 DIAGNOSIS — F429 Obsessive-compulsive disorder, unspecified: Secondary | ICD-10-CM | POA: Diagnosis not present

## 2023-01-15 DIAGNOSIS — F329 Major depressive disorder, single episode, unspecified: Secondary | ICD-10-CM | POA: Diagnosis not present

## 2023-01-15 DIAGNOSIS — F419 Anxiety disorder, unspecified: Secondary | ICD-10-CM | POA: Diagnosis not present

## 2023-02-14 ENCOUNTER — Ambulatory Visit (INDEPENDENT_AMBULATORY_CARE_PROVIDER_SITE_OTHER): Payer: 59 | Admitting: Adult Health

## 2023-02-14 ENCOUNTER — Encounter: Payer: Self-pay | Admitting: Adult Health

## 2023-02-14 VITALS — BP 114/78 | HR 69 | Ht 63.0 in | Wt 211.5 lb

## 2023-02-14 DIAGNOSIS — Z13220 Encounter for screening for lipoid disorders: Secondary | ICD-10-CM

## 2023-02-14 DIAGNOSIS — Z90710 Acquired absence of both cervix and uterus: Secondary | ICD-10-CM

## 2023-02-14 DIAGNOSIS — Z1211 Encounter for screening for malignant neoplasm of colon: Secondary | ICD-10-CM | POA: Diagnosis not present

## 2023-02-14 DIAGNOSIS — Z01419 Encounter for gynecological examination (general) (routine) without abnormal findings: Secondary | ICD-10-CM | POA: Diagnosis not present

## 2023-02-14 DIAGNOSIS — Z90721 Acquired absence of ovaries, unilateral: Secondary | ICD-10-CM

## 2023-02-14 LAB — HEMOCCULT GUIAC POC 1CARD (OFFICE): Fecal Occult Blood, POC: NEGATIVE

## 2023-02-14 NOTE — Progress Notes (Signed)
Patient ID: Deborah Humphrey, female   DOB: 1976/09/04, 47 y.o.   MRN: [id number] History of Present Illness: Deborah Humphrey is a 47 year old white female, married, sp [facility] 05/01/22, in for a well woman gyn exam..  PCP is Aggie Hacker PA.   Current Medications, Allergies, Past Medical History, Past Surgical History, Family History and Social History were reviewed in [employer] record.     Review of Systems: Patient denies any hearing loss, fatigue, blurred vision, shortness of breath, chest pain, abdominal pain, problems with bowel movements(on trulance), urination, or intercourse. No joint pain or mood swings.  Has decreased sex drive and is hot.  Has had headaches and neck stiffness and is seeing chiropractor   Physical Exam:BP 114/78 (BP Location: Left Arm, Patient Position: Sitting, Cuff Size: Normal)   Pulse 69   Ht 5\' 3"  (1.6 m)   Wt 211 lb 8 oz (95.9 kg)   LMP 07/28/2020 (Exact Date)   BMI 37.47 kg/m   General:  Well developed, well nourished, no acute distress Skin:  Warm and dry Neck:  Midline trachea, normal thyroid, good ROM, no lymphadenopathy Lungs; Clear to auscultation bilaterally Breast:  No dominant palpable mass, retraction, or nipple discharge Cardiovascular: Regular rate and rhythm Abdomen:  Soft, non tender, no hepatosplenomegaly Pelvic:  External genitalia is normal in appearance, no lesions.  The vagina is normal in appearance. Urethra has no lesions or masses. The cervix and uterus are absent. No adnexal masses or tenderness noted.Bladder is non tender, no masses felt. Rectal: Good sphincter tone, no polyps, or hemorrhoids felt.  Hemoccult negative. Extremities/musculoskeletal:  No swelling or varicosities noted, no clubbing or cyanosis Psych:  No mood changes, alert and cooperative,seems happy AA is 3 Fall risk is low    02/14/2023   10:45 AM 01/28/2022   10:39 AM 01/25/2021    1:43 PM  Depression screen PHQ 2/9  Decreased Interest 0 0 0  Down,  Depressed, Hopeless 0 0 0  PHQ - 2 Score 0 0 0  Altered sleeping 2 0 0  Tired, decreased energy 2 1 0  Change in appetite 1 0 1  Feeling bad or failure about yourself  0 0 0  Trouble concentrating 1 1 0  Moving slowly or fidgety/restless 0 0 0  Suicidal thoughts 0 0 0  PHQ-9 Score 6 2 1    She sees Smitty Pluck at [place] for counseling     02/14/2023   10:45 AM 01/28/2022   10:39 AM 01/25/2021    1:43 PM 06/21/2020    3:22 PM  GAD 7 : Generalized Anxiety Score  Nervous, Anxious, on Edge 2 1 0 1  Control/stop worrying 1 2 0 1  Worry too much - different things 1 2 0 1  Trouble relaxing 1 1 0 0  Restless 0 0 1 0  Easily annoyed or irritable 0 2 0 0  Afraid - awful might happen 0 0 0 0  Total GAD 7 Score 5 8 1 3       Upstream - 02/14/23 1050       Pregnancy Intention Screening   Does the patient want to become pregnant in the next year? N/A    Does the patient's partner want to become pregnant in the next year? N/A    Would the patient like to discuss contraceptive options today? N/A      Contraception Wrap Up   Current Method Female Sterilization   hyst   End Method Female Sterilization  hyst   Contraception Counseling Provided No             Examination chaperoned by Levy Pupa LPN  Impression and Plan: 1. Encounter for well woman exam with routine gynecological exam Physical in 1 year Will check labs, orders given to do fasting Mammogram was negative 03/27/22 Colonoscopy per GI  - CBC - Comprehensive metabolic panel - Lipid panel  2. Encounter for screening fecal occult blood testing Hemoccult was negative - POCT occult blood stool  3. S/P hysterectomy with oophorectomy, right  4. Screening cholesterol level - Lipid panel

## 2023-02-25 DIAGNOSIS — F429 Obsessive-compulsive disorder, unspecified: Secondary | ICD-10-CM | POA: Diagnosis not present

## 2023-02-25 DIAGNOSIS — F988 Other specified behavioral and emotional disorders with onset usually occurring in childhood and adolescence: Secondary | ICD-10-CM | POA: Diagnosis not present

## 2023-02-25 DIAGNOSIS — F419 Anxiety disorder, unspecified: Secondary | ICD-10-CM | POA: Diagnosis not present

## 2023-02-25 DIAGNOSIS — F329 Major depressive disorder, single episode, unspecified: Secondary | ICD-10-CM | POA: Diagnosis not present

## 2023-03-11 DIAGNOSIS — F419 Anxiety disorder, unspecified: Secondary | ICD-10-CM | POA: Diagnosis not present

## 2023-03-11 DIAGNOSIS — F429 Obsessive-compulsive disorder, unspecified: Secondary | ICD-10-CM | POA: Diagnosis not present

## 2023-03-11 DIAGNOSIS — F988 Other specified behavioral and emotional disorders with onset usually occurring in childhood and adolescence: Secondary | ICD-10-CM | POA: Diagnosis not present

## 2023-03-11 DIAGNOSIS — F329 Major depressive disorder, single episode, unspecified: Secondary | ICD-10-CM | POA: Diagnosis not present

## 2023-03-18 ENCOUNTER — Other Ambulatory Visit (HOSPITAL_COMMUNITY): Payer: Self-pay | Admitting: Adult Health

## 2023-03-18 DIAGNOSIS — Z1231 Encounter for screening mammogram for malignant neoplasm of breast: Secondary | ICD-10-CM

## 2023-03-31 ENCOUNTER — Other Ambulatory Visit (INDEPENDENT_AMBULATORY_CARE_PROVIDER_SITE_OTHER): Payer: Self-pay | Admitting: Gastroenterology

## 2023-03-31 ENCOUNTER — Other Ambulatory Visit (INDEPENDENT_AMBULATORY_CARE_PROVIDER_SITE_OTHER): Payer: Self-pay

## 2023-03-31 DIAGNOSIS — K219 Gastro-esophageal reflux disease without esophagitis: Secondary | ICD-10-CM

## 2023-03-31 DIAGNOSIS — K2270 Barrett's esophagus without dysplasia: Secondary | ICD-10-CM

## 2023-03-31 MED ORDER — OMEPRAZOLE 40 MG PO CPDR: DELAYED_RELEASE_CAPSULE | ORAL | 3 refills | Status: DC

## 2023-04-02 ENCOUNTER — Encounter (HOSPITAL_COMMUNITY): Payer: Self-pay

## 2023-04-02 ENCOUNTER — Ambulatory Visit (HOSPITAL_COMMUNITY)
Admission: RE | Admit: 2023-04-02 | Discharge: 2023-04-02 | Disposition: A | Discharge status: Home / Self Care | Payer: 59 | Source: Ambulatory Visit | Attending: Adult Health | Admitting: Adult Health

## 2023-04-02 DIAGNOSIS — Z1231 Encounter for screening mammogram for malignant neoplasm of breast: Secondary | ICD-10-CM | POA: Diagnosis not present

## 2023-04-02 DIAGNOSIS — Z01419 Encounter for gynecological examination (general) (routine) without abnormal findings: Secondary | ICD-10-CM | POA: Diagnosis not present

## 2023-04-02 DIAGNOSIS — Z13220 Encounter for screening for lipoid disorders: Secondary | ICD-10-CM | POA: Diagnosis not present

## 2023-04-03 LAB — LIPID PANEL
Chol/HDL Ratio: 3.1 ratio (ref 0.0–4.4)
Cholesterol, Total: 175 mg/dL (ref 100–199)
HDL: 56 mg/dL (ref >39)
LDL Chol Calc (NIH): 100 mg/dL — ABNORMAL HIGH (ref 0–99)
Triglycerides: 107 mg/dL (ref 0–149)
VLDL Cholesterol Cal: 19 mg/dL (ref 5–40)

## 2023-04-03 LAB — CBC
Hematocrit: 43.2 % (ref 34.0–46.6)
Hemoglobin: 14.7 g/dL (ref 11.1–15.9)
MCH: 29.4 pg (ref 26.6–33.0)
MCHC: 34 g/dL (ref 31.5–35.7)
MCV: 86 fL (ref 79–97)
Platelets: 211 10*3/uL (ref 150–450)
RBC: 5 x10E6/uL (ref 3.77–5.28)
RDW: 12.2 % (ref 11.7–15.4)
WBC: 4.5 10*3/uL (ref 3.4–10.8)

## 2023-04-03 LAB — COMPREHENSIVE METABOLIC PANEL
ALT: 24 IU/L (ref 0–32)
AST: 25 IU/L (ref 0–40)
Albumin/Globulin Ratio: 1.8 (ref 1.2–2.2)
Albumin: 4.3 g/dL (ref 3.9–4.9)
Alkaline Phosphatase: 111 IU/L (ref 44–121)
BUN/Creatinine Ratio: 9 (ref 9–23)
BUN: 7 mg/dL (ref 6–24)
Bilirubin Total: 0.4 mg/dL (ref 0.0–1.2)
CO2: 19 mmol/L — ABNORMAL LOW (ref 20–29)
Calcium: 9.4 mg/dL (ref 8.7–10.2)
Chloride: 101 mmol/L (ref 96–106)
Creatinine, Ser: 0.78 mg/dL (ref 0.57–1.00)
Globulin, Total: 2.4 g/dL (ref 1.5–4.5)
Glucose: 91 mg/dL (ref 70–99)
Potassium: 4.1 mmol/L (ref 3.5–5.2)
Sodium: 136 mmol/L (ref 134–144)
Total Protein: 6.7 g/dL (ref 6.0–8.5)
eGFR: 95 mL/min/{1.73_m2} (ref >59)

## 2023-04-10 DIAGNOSIS — F419 Anxiety disorder, unspecified: Secondary | ICD-10-CM | POA: Diagnosis not present

## 2023-04-10 DIAGNOSIS — F988 Other specified behavioral and emotional disorders with onset usually occurring in childhood and adolescence: Secondary | ICD-10-CM | POA: Diagnosis not present

## 2023-04-10 DIAGNOSIS — F429 Obsessive-compulsive disorder, unspecified: Secondary | ICD-10-CM | POA: Diagnosis not present

## 2023-04-14 DIAGNOSIS — F429 Obsessive-compulsive disorder, unspecified: Secondary | ICD-10-CM | POA: Diagnosis not present

## 2023-04-14 DIAGNOSIS — F419 Anxiety disorder, unspecified: Secondary | ICD-10-CM | POA: Diagnosis not present

## 2023-04-14 DIAGNOSIS — Z6836 Body mass index (BMI) 36.0-36.9, adult: Secondary | ICD-10-CM | POA: Diagnosis not present

## 2023-04-14 DIAGNOSIS — F988 Other specified behavioral and emotional disorders with onset usually occurring in childhood and adolescence: Secondary | ICD-10-CM | POA: Diagnosis not present

## 2023-04-14 DIAGNOSIS — R030 Elevated blood-pressure reading, without diagnosis of hypertension: Secondary | ICD-10-CM | POA: Diagnosis not present

## 2023-05-21 DIAGNOSIS — F988 Other specified behavioral and emotional disorders with onset usually occurring in childhood and adolescence: Secondary | ICD-10-CM | POA: Diagnosis not present

## 2023-05-21 DIAGNOSIS — F419 Anxiety disorder, unspecified: Secondary | ICD-10-CM | POA: Diagnosis not present

## 2023-05-21 DIAGNOSIS — F329 Major depressive disorder, single episode, unspecified: Secondary | ICD-10-CM | POA: Diagnosis not present

## 2023-05-29 DIAGNOSIS — Z87891 Personal history of nicotine dependence: Secondary | ICD-10-CM | POA: Diagnosis not present

## 2023-05-29 DIAGNOSIS — Z6836 Body mass index (BMI) 36.0-36.9, adult: Secondary | ICD-10-CM | POA: Diagnosis not present

## 2023-05-29 DIAGNOSIS — F411 Generalized anxiety disorder: Secondary | ICD-10-CM | POA: Diagnosis not present

## 2023-05-29 DIAGNOSIS — E669 Obesity, unspecified: Secondary | ICD-10-CM | POA: Diagnosis not present

## 2023-05-29 DIAGNOSIS — Z803 Family history of malignant neoplasm of breast: Secondary | ICD-10-CM | POA: Diagnosis not present

## 2023-05-29 DIAGNOSIS — Z833 Family history of diabetes mellitus: Secondary | ICD-10-CM | POA: Diagnosis not present

## 2023-05-29 DIAGNOSIS — K581 Irritable bowel syndrome with constipation: Secondary | ICD-10-CM | POA: Diagnosis not present

## 2023-05-29 DIAGNOSIS — K219 Gastro-esophageal reflux disease without esophagitis: Secondary | ICD-10-CM | POA: Diagnosis not present

## 2023-05-29 DIAGNOSIS — F909 Attention-deficit hyperactivity disorder, unspecified type: Secondary | ICD-10-CM | POA: Diagnosis not present

## 2023-05-29 DIAGNOSIS — Z811 Family history of alcohol abuse and dependence: Secondary | ICD-10-CM | POA: Diagnosis not present

## 2023-06-02 ENCOUNTER — Telehealth (INDEPENDENT_AMBULATORY_CARE_PROVIDER_SITE_OTHER): Payer: Self-pay | Admitting: *Deleted

## 2023-06-02 NOTE — Telephone Encounter (Signed)
Fax from [employer]. Trulance 3mg  tablets were denied. Pt had tried linzess in the past and no relief. Pt must of tried and failed two preffered. She states trulance works well and she was getting through [employer] patient assistance. Will fill out forms for [employer] and let pt know when ready to pick up to do her part.

## 2023-06-03 NOTE — Telephone Encounter (Signed)
Discussed with patient and she wanted her part to fill out for pt assist to be mailed to her. Mailed her part and when forms are received back will fax in for patient.

## 2023-06-13 ENCOUNTER — Other Ambulatory Visit (INDEPENDENT_AMBULATORY_CARE_PROVIDER_SITE_OTHER): Payer: Self-pay | Admitting: Gastroenterology

## 2023-06-13 DIAGNOSIS — K581 Irritable bowel syndrome with constipation: Secondary | ICD-10-CM

## 2023-06-17 DIAGNOSIS — F988 Other specified behavioral and emotional disorders with onset usually occurring in childhood and adolescence: Secondary | ICD-10-CM | POA: Diagnosis not present

## 2023-06-17 DIAGNOSIS — F419 Anxiety disorder, unspecified: Secondary | ICD-10-CM | POA: Diagnosis not present

## 2023-06-17 DIAGNOSIS — F329 Major depressive disorder, single episode, unspecified: Secondary | ICD-10-CM | POA: Diagnosis not present

## 2023-06-24 NOTE — Telephone Encounter (Signed)
Forms brought back in and Dr. Levon Hedger signed and I faxed over today.

## 2023-06-27 NOTE — Telephone Encounter (Signed)
Called pt assist and was told she was approved til 09/27/23. Will keep her forms at my desk to refax closer to due date. Company was unable to process application when I called this early. Pt was notified as well.

## 2023-07-05 DIAGNOSIS — Z6837 Body mass index (BMI) 37.0-37.9, adult: Secondary | ICD-10-CM | POA: Diagnosis not present

## 2023-07-05 DIAGNOSIS — S40861A Insect bite (nonvenomous) of right upper arm, initial encounter: Secondary | ICD-10-CM | POA: Diagnosis not present

## 2023-07-05 DIAGNOSIS — R030 Elevated blood-pressure reading, without diagnosis of hypertension: Secondary | ICD-10-CM | POA: Diagnosis not present

## 2023-07-07 DIAGNOSIS — W57XXXA Bitten or stung by nonvenomous insect and other nonvenomous arthropods, initial encounter: Secondary | ICD-10-CM | POA: Diagnosis not present

## 2023-07-07 DIAGNOSIS — S40861A Insect bite (nonvenomous) of right upper arm, initial encounter: Secondary | ICD-10-CM | POA: Diagnosis not present

## 2023-07-07 DIAGNOSIS — Z7689 Persons encountering health services in other specified circumstances: Secondary | ICD-10-CM | POA: Diagnosis not present

## 2023-07-14 ENCOUNTER — Other Ambulatory Visit (INDEPENDENT_AMBULATORY_CARE_PROVIDER_SITE_OTHER): Payer: Self-pay | Admitting: *Deleted

## 2023-08-27 ENCOUNTER — Telehealth (INDEPENDENT_AMBULATORY_CARE_PROVIDER_SITE_OTHER): Payer: Self-pay | Admitting: *Deleted

## 2023-08-27 NOTE — Telephone Encounter (Signed)
Fax from [employer]. Patient is approved through 08/25/24 for assistnace for trulance at no cost. Patient was called and notified. She is aware to call office if having any issues getting med. Approval letter given to medical records to be scanned.

## 2023-09-02 ENCOUNTER — Ambulatory Visit: Payer: 59 | Admitting: Adult Health

## 2023-09-02 ENCOUNTER — Encounter: Payer: Self-pay | Admitting: Adult Health

## 2023-09-02 VITALS — BP 120/88 | HR 89 | Ht 63.0 in | Wt 208.0 lb

## 2023-09-02 DIAGNOSIS — N6325 Unspecified lump in the left breast, overlapping quadrants: Secondary | ICD-10-CM | POA: Diagnosis not present

## 2023-09-02 NOTE — Progress Notes (Signed)
  Subjective:     Patient ID: Deborah Humphrey, female   DOB: [birth year], 47 y.o.   MRN: [id number]  HPI Deborah Humphrey is a 47 year old white female,married, G1P1001, in complaining of lump in left breast for 4 days and breast is achy and itches. She has 2 sisters and paternal aunt with breast cancer. She had negative mammogram 04/02/23.     Component Value Date/Time   DIAGPAP  01/25/2021 1343    - Negative for intraepithelial lesion or malignancy (NILM)   HPVHIGH Negative 01/25/2021 1343   ADEQPAP  01/25/2021 1343    Satisfactory for evaluation; transformation zone component PRESENT.    PCP is Bunnie Pion PA  Review of Systems Left breast lump Left breasts achy and itches, but right itches too Reviewed past medical,surgical, social and family history. Reviewed medications and allergies.     Objective:   Physical Exam BP 120/88 (BP Location: Left Arm, Patient Position: Sitting, Cuff Size: Normal)   Pulse 89   Ht 5\' 3"  (1.6 m)   Wt 208 lb (94.3 kg)   LMP 07/28/2020 (Exact Date)   BMI 36.85 kg/m      Skin warm and dry,  Breasts:no dominate palpable mass, retraction or nipple discharge on the right, on the left,no retraction or nipple discharge, but has pea sized mass at 12 o'clock 4 FB from nipple and another at 9 o'clock 2 FB from nipple. She has scratched and has some excoriation on both breasts. Has regular irregularities in both breasts.  Fall risk is low  Upstream - 09/02/23 0918       Pregnancy Intention Screening   Does the patient want to become pregnant in the next year? No    Would the patient like to discuss contraceptive options today? No      Contraception Wrap Up   Current Method Female Sterilization   hysterectomy   End Method Female Sterilization   hysterectomy   Contraception Counseling Provided No             Assessment:     1. Mass overlapping multiple quadrants of left breast Has mass at 9 o'clock and 12 o'clock on left breast Left breast achy and itches Will  get diagnostic mammogram and [place] 09/09/23 at 10:20 am at [facility] - MM 3D DIAGNOSTIC MAMMOGRAM UNILATERAL LEFT BREAST; Future - [place] LIMITED ULTRASOUND INCLUDING AXILLA LEFT BREAST ; Future     Plan:     Follow up prn

## 2023-09-09 ENCOUNTER — Ambulatory Visit (HOSPITAL_COMMUNITY)
Admission: RE | Admit: 2023-09-09 | Discharge: 2023-09-09 | Disposition: A | Discharge status: Home / Self Care | Payer: 59 | Source: Ambulatory Visit | Attending: Adult Health | Admitting: Adult Health

## 2023-09-09 DIAGNOSIS — N6325 Unspecified lump in the left breast, overlapping quadrants: Secondary | ICD-10-CM | POA: Insufficient documentation

## 2023-09-09 DIAGNOSIS — R92322 Mammographic fibroglandular density, left breast: Secondary | ICD-10-CM | POA: Diagnosis not present

## 2023-10-06 ENCOUNTER — Encounter (INDEPENDENT_AMBULATORY_CARE_PROVIDER_SITE_OTHER): Payer: Self-pay | Admitting: Gastroenterology

## 2023-10-06 ENCOUNTER — Ambulatory Visit (INDEPENDENT_AMBULATORY_CARE_PROVIDER_SITE_OTHER): Payer: 59 | Admitting: Gastroenterology

## 2023-10-06 VITALS — BP 123/81 | HR 92 | Temp 97.1°F | Ht 63.0 in | Wt 206.1 lb

## 2023-10-06 DIAGNOSIS — K2270 Barrett's esophagus without dysplasia: Secondary | ICD-10-CM

## 2023-10-06 DIAGNOSIS — K581 Irritable bowel syndrome with constipation: Secondary | ICD-10-CM

## 2023-10-06 DIAGNOSIS — K219 Gastro-esophageal reflux disease without esophagitis: Secondary | ICD-10-CM | POA: Diagnosis not present

## 2023-10-06 DIAGNOSIS — R1310 Dysphagia, unspecified: Secondary | ICD-10-CM | POA: Diagnosis not present

## 2023-10-06 DIAGNOSIS — K641 Second degree hemorrhoids: Secondary | ICD-10-CM

## 2023-10-06 DIAGNOSIS — R112 Nausea with vomiting, unspecified: Secondary | ICD-10-CM | POA: Diagnosis not present

## 2023-10-06 DIAGNOSIS — K582 Mixed irritable bowel syndrome: Secondary | ICD-10-CM

## 2023-10-06 MED ORDER — PROMETHAZINE HCL 12.5 MG PO TABS
12.5000 mg | ORAL_TABLET | Freq: Three times a day (TID) | ORAL | 1 refills | Status: DC | PRN
Start: 2023-10-06 — End: 2024-01-01

## 2023-10-06 MED ORDER — HYDROCORTISONE (PERIANAL) 2.5 % EX CREA
1.0000 | TOPICAL_CREAM | Freq: Two times a day (BID) | CUTANEOUS | 1 refills | Status: AC
Start: 2023-10-06 — End: ?

## 2023-10-06 NOTE — Progress Notes (Signed)
Deborah Humphrey, M.D. Gastroenterology & Hepatology [facility] Gastroenterology [address] [place], [place] [address]  Primary Care Physician: Lovey Newcomer, [place] [address] [place] [address]  I will communicate my assessment and recommendations to the referring MD via EMR.  Problems:  IBS-C GERD Barrett's esophagus Recurrent dry heaving   History of Present Illness: Deborah Humphrey is a 47 y.o. 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   The patient was last seen on 10/07/2022. At that time, the patient was continued on Trulance 3 mg every day.  She was advised to continue omeprazole 40 mg every day.  Patient reports that for the last month she has presented worsening episodes of dry heaving with some episodes of vomiting. She is concerned as this is now happening on a daily basis. She does not feel that Zofran is actually helping with the nausea episodes. She is also endorsing having sour taste liquid coming up as well. She is having heartburn a couple of times a week. She is taking omeprazole 40 mg qday. She started taking Tums when she feels the burning sensation. Tried drinking peppermint, which helped with nausea but made heartburn worse.   Has felt a few times that she has had some issues swallowing - states food is taking longer to go down.  States she was started on atomoxetine.around June 2024. She does not recall if she had the dry heaving prior to this.  She is taking Trulance 3 mg every day, which helps moving her bowels 1-2 times a day. States this has helped.  She reports that for the last month she has had some bleeding when she wipes in her rectum. Also reports that she has some itching and discomfort in the anal area. She believes these are hemorrhoid related. She uses Equate brand wipes to improve the  discomfort.  The patient denies having any nausea, vomiting, fever, chills, hematochezia, melena, hematemesis, abdominal distention, abdominal pain, diarrhea, jaundice, pruritus .  She has lost 4 pounds since previous appointment, has been working on weight loss strategies at home.  Last EGD: 02/28/2021, 3 tongues of salmon-colored mucosa suggestive of Barrett's esophagus extending for 2 cm, confirmed with biopsies.  Normal stomach and small bowel. With normal biopsies of the small bowel.    Last Colonoscopy: 02/28/2021, normal colon with neg biopsies for microscopic colitis.  Past Medical History: Past Medical History:  Diagnosis Date   Anxiety    Barrett's esophagus    Cervical spine fracture (HCC)    Denies havign surgery    GERD (gastroesophageal reflux disease)    gestational hypertention    gestational hypertension   Helicobacter pylori (H. pylori)    IBS (irritable bowel syndrome)    Infertility, female     Past Surgical History: Past Surgical History:  Procedure Laterality Date   ABDOMINAL HYSTERECTOMY N/A 05/01/2022   Procedure: HYSTERECTOMY ABDOMINAL;  Surgeon: Lazaro Arms, MD;  [facility];  Service: Gynecology;  Laterality: N/A;   BIOPSY  02/28/2021   Procedure: BIOPSY;  Surgeon: Dolores Frame, MD;  [facility];  Service: Gastroenterology;;   CESAREAN SECTION     COLONOSCOPY WITH PROPOFOL N/A 02/28/2021   Procedure: COLONOSCOPY WITH PROPOFOL;  Surgeon: Dolores Frame, MD;  [facility];  Service: Gastroenterology;  Laterality: N/A;  AM   DILATATION AND  CURETTAGE/HYSTEROSCOPY WITH MINERVA N/A 08/22/2020   Procedure: DILATATION AND CURETTAGE/HYSTEROSCOPY WITH MINERVA;  Surgeon: Tilda Burrow, MD;  Location: [facility];  Service: Gynecology;  Laterality: N/A;   DILATION AND CURETTAGE OF UTERUS     x3   ESOPHAGOGASTRODUODENOSCOPY (EGD) WITH PROPOFOL N/A 02/28/2021   Procedure: ESOPHAGOGASTRODUODENOSCOPY (EGD) WITH PROPOFOL;   Surgeon: Dolores Frame, MD;  [facility];  Service: Gastroenterology;  Laterality: N/A;   HALO APPLICATION     age 51   LAPAROSCOPIC BILATERAL SALPINGECTOMY Bilateral 08/22/2020   Procedure: LAPAROSCOPIC BILATERAL SALPINGECTOMY;  Surgeon: Tilda Burrow, MD;  Location: [facility];  Service: Gynecology;  Laterality: Bilateral;   OOPHORECTOMY Right 05/01/2022   Procedure: OOPHORECTOMY;  Surgeon: Lazaro Arms, MD;  Location: [facility];  Service: Gynecology;  Laterality: Right;    Family History: Family History  Problem Relation Age of Onset   Cancer Paternal Grandfather        pancreatic?   Cancer Paternal Grandmother        liver   Dementia Maternal Grandmother    Cancer Father        pancreatic   Cancer Mother        skin   Breast cancer Sister 45   Polycystic ovary syndrome Sister    Endometriosis Sister    Breast cancer Sister 26   Breast cancer Paternal Aunt     Social History: Social History   Tobacco Use  Smoking Status Former   Current packs/day: 0.00   Average packs/day: 1 pack/day for 15.0 years (15.0 ttl pk-yrs)   Types: Cigarettes   Start date: 01/31/1996   Quit date: 01/31/2011   Years since quitting: 12.6  Smokeless Tobacco Never   Social History   Substance and Sexual Activity  Alcohol Use Yes   Comment: occassional   Social History   Substance and Sexual Activity  Drug Use No    Allergies: Allergies  Allergen Reactions   Bee Venom Swelling    Medications: Current Outpatient Medications  Medication Sig Dispense Refill   atomoxetine (STRATTERA) 40 MG capsule Take 40 mg by mouth daily.     escitalopram (LEXAPRO) 20 MG tablet Take 20 mg by mouth daily.     ibuprofen (ADVIL) 800 MG tablet Take by mouth every 6 (six) hours as needed.     Lactobacillus-Inulin (CULTURELLE ADULT ULT BALANCE PO) Take 1 capsule by mouth daily.     LORazepam (ATIVAN) 0.5 MG tablet Take 0.5 mg by mouth daily at 6 (six) AM.     omeprazole (PRILOSEC) 40  MG capsule TAKE 1 CAPSULE BY MOUTH EVERY DAY BEFORE breakfast 90 capsule 3   ondansetron (ZOFRAN-ODT) 4 MG disintegrating tablet Take 4 mg by mouth every 8 (eight) hours as needed for nausea or vomiting.     TRULANCE 3 MG TABS TAKE ONE TABLET BY MOUTH DAILY 90 tablet 2   buPROPion (WELLBUTRIN XL) 150 MG 24 hr tablet Take 150 mg by mouth daily. (Patient not taking: Reported on 09/02/2023)     OVER THE COUNTER MEDICATION Take 1 capsule by mouth daily. Plant Enzymes (Patient not taking: Reported on 09/02/2023)     No current facility-administered medications for this visit.    Review of Systems: GENERAL: negative for malaise, night sweats HEENT: No changes in hearing or vision, no nose bleeds or other nasal problems. NECK: Negative for lumps, goiter, pain and significant neck swelling RESPIRATORY: Negative for cough, wheezing CARDIOVASCULAR: Negative for chest pain, leg swelling, palpitations, orthopnea GI:  SEE HPI MUSCULOSKELETAL: Negative for joint pain or swelling, back pain, and muscle pain. SKIN: Negative for lesions, rash PSYCH: Negative for sleep disturbance, mood disorder and recent psychosocial stressors. HEMATOLOGY Negative for prolonged bleeding, bruising easily, and swollen nodes. ENDOCRINE: Negative for cold or heat intolerance, polyuria, polydipsia and goiter. NEURO: negative for tremor, gait imbalance, syncope and seizures. The remainder of the review of systems is noncontributory.   Physical Exam: BP 123/81 (BP Location: Left Arm, Patient Position: Sitting, Cuff Size: Large)   Pulse 92   Temp (!) 97.1 F (36.2 C) (Temporal)   Ht 5\' 3"  (1.6 m)   Wt 206 lb 1.6 oz (93.5 kg)   LMP 07/28/2020 (Exact Date)   BMI 36.51 kg/m  GENERAL: The patient is AO x3, in no acute distress. HEENT: Head is normocephalic and atraumatic. EOMI are intact. Mouth is well hydrated and without lesions. NECK: Supple. No masses LUNGS: Clear to auscultation. No presence of rhonchi/wheezing/rales.  Adequate chest expansion HEART: RRR, normal s1 and s2. ABDOMEN: Soft, nontender, no guarding, no peritoneal signs, and nondistended. BS +. No masses. RECTAL EXAM: Presence of mildly tender grade 2 external hemorrhoids without bleeding, normal tone, no masses, brown stool without blood.Chaperon: Joanette Gula, CMA EXTREMITIES: Without any cyanosis, clubbing, rash, lesions or edema. NEUROLOGIC: AOx3, no focal motor deficit. SKIN: no jaundice, no rashes   Imaging/Labs: as above  I personally reviewed and interpreted the available labs, imaging and endoscopic files.  Impression and Plan: Deborah Humphrey is a 47 y.o. 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   The patient has presented new onset of dry heaving and nausea recently.  It is possible this is related to worsening reflux episodes, although she is not presenting the symptoms exclusively with associated heartburn.  It is possible this is medication related as she started atomoxetine briefly before she started presenting this complaints.  I advised her she should discuss with her prescribing provider if possible to stop this medication and see if she responds to this strategy.  For now she can take some Phenergan as needed to relieve her complaints we will start her on famotidine 40 mg at night in conjunction with omeprazole 40 mg daily to further control her heartburn episodes.  She has been working on weight loss, which can potentially have a positive impact in her reflux control.  Notably, she is still interested in pursuing TIF despite of the fact she may still need to take low-dose PPI given her history of Barrett's esophagus.  We discussed in detail this and she is still interested as she would like to take less medication and avoid recurrence of heartburn episodes as much as possible.  Her IBS C seems to be better  controlled with the use of Trulance, which she should continue using for now.  Nevertheless, she is presenting with symptomatic external hemorrhoids for which we will continue topical measures to relieve her symptoms.  -Continue omeprazole 40 mg every day -Start famotidine 40 mg at night -Keep working on weight loss -Discuss with PCP or prescribing provider regarding the possibility of stopping atomoxetine -Take Phenergan as needed for vomiting/nausea -If weight is stable he at 195 pounds, we will discuss the possibility of performing TIF -Continue Trulance 3 mg every day -Try to keep stools soft  -Do no strain to move bowels -Use bath sitz regularly -Start topical hydrocortisone ointment for  hemorrhoids for 14 days  All questions were answered.      Deborah Blazing, MD Gastroenterology and Hepatology [facility] Gastroenterology

## 2023-10-06 NOTE — Patient Instructions (Addendum)
Continue omeprazole 40 mg every day Start famotidine 40 mg at night Keep working on weight loss Discussed with PCP or prescribing provider regarding the possibility of stopping atomoxetine Take Phenergan as needed for vomiting/nausea If weight is stable he at 195 pounds, we will discuss the possibility of performing TIF Continue Trulance 3 mg every day Try to keep stools soft  Do no strain to move bowels Use bath sitz regularly Start topical hydrocortisone ointment for hemorrhoids for 14 days

## 2023-10-23 DIAGNOSIS — F419 Anxiety disorder, unspecified: Secondary | ICD-10-CM | POA: Diagnosis not present

## 2023-10-23 DIAGNOSIS — F429 Obsessive-compulsive disorder, unspecified: Secondary | ICD-10-CM | POA: Diagnosis not present

## 2023-10-23 DIAGNOSIS — Z6835 Body mass index (BMI) 35.0-35.9, adult: Secondary | ICD-10-CM | POA: Diagnosis not present

## 2023-10-23 DIAGNOSIS — F988 Other specified behavioral and emotional disorders with onset usually occurring in childhood and adolescence: Secondary | ICD-10-CM | POA: Diagnosis not present

## 2023-12-10 ENCOUNTER — Encounter (INDEPENDENT_AMBULATORY_CARE_PROVIDER_SITE_OTHER): Payer: Self-pay | Admitting: Gastroenterology

## 2024-01-01 ENCOUNTER — Encounter (INDEPENDENT_AMBULATORY_CARE_PROVIDER_SITE_OTHER): Payer: Self-pay | Admitting: Gastroenterology

## 2024-01-01 ENCOUNTER — Ambulatory Visit (INDEPENDENT_AMBULATORY_CARE_PROVIDER_SITE_OTHER): Payer: 59 | Admitting: Gastroenterology

## 2024-01-01 VITALS — BP 123/86 | HR 106 | Temp 97.8°F | Ht 63.0 in | Wt 197.5 lb

## 2024-01-01 DIAGNOSIS — K582 Mixed irritable bowel syndrome: Secondary | ICD-10-CM

## 2024-01-01 DIAGNOSIS — K2270 Barrett's esophagus without dysplasia: Secondary | ICD-10-CM

## 2024-01-01 DIAGNOSIS — Z8719 Personal history of other diseases of the digestive system: Secondary | ICD-10-CM | POA: Diagnosis not present

## 2024-01-01 DIAGNOSIS — R1110 Vomiting, unspecified: Secondary | ICD-10-CM | POA: Diagnosis not present

## 2024-01-01 DIAGNOSIS — K219 Gastro-esophageal reflux disease without esophagitis: Secondary | ICD-10-CM | POA: Diagnosis not present

## 2024-01-01 DIAGNOSIS — K581 Irritable bowel syndrome with constipation: Secondary | ICD-10-CM | POA: Diagnosis not present

## 2024-01-01 DIAGNOSIS — R112 Nausea with vomiting, unspecified: Secondary | ICD-10-CM

## 2024-01-01 MED ORDER — FAMOTIDINE 40 MG PO TABS: 40.0000 mg | ORAL_TABLET | Freq: Every day | ORAL | 3 refills | Status: DC

## 2024-01-01 NOTE — Patient Instructions (Signed)
Continue Trulance 3 mg every day Increase intake of MiraLAX 2-3 capfuls every day If presenting recurrent straining when defecating, please reconsider proceeding with anorectal manometry at [facility] Continue omeprazole 40 mg every day Start famotidine 40 mg at bedtime

## 2024-01-01 NOTE — Progress Notes (Signed)
Deborah Humphrey, M.D. Gastroenterology & Hepatology [facility] Gastroenterology [address] [place], [place] [address]  Primary Care Physician: Lovey Newcomer, [place] [address] [place] [address]  I will communicate my assessment and recommendations to the referring MD via EMR.  Problems: IBS-C GERD Barrett's esophagus Recurrent dry heaving  History of Present Illness: Deborah Humphrey is a 48 y.o. 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   The patient was last seen on 10/06/23. At that time, the patient was continued on omeprazole 40 mg every day and started on famotidine 40 mg at night.  I advised her to discuss with her PCP the possibility of stopping atomoxetine.  She states that she discussed with her PCP and was told that they did not believe the atomoxetine was causing her dry heaving.  Patient reports that she was doing relatively well until the last couple of weeks she has presented some exacerbation of her dry heaving and also a couple of vomiting episodes. She reports that her constipation has been worse  as she is moving her bowels daily but has to strain and does not feeel she has completely emptied her bowels. She is taking Trulance 3 mg qday and also Miralax 1 capful daily. She had some pain in her abdomen as she was having some constipation.  Has also presented some scratchy throat. She reports that sometimes she feels more discomfort after dentures are reapplied. She is taking her Prilosec 40 mg every day but is only taking Tums when she has heartburn.  Never had an anorectal manometry in the past.  The patient denies having any nausea, vomiting, fever, chills, hematochezia, melena, hematemesis, abdominal distention, diarrhea, jaundice, pruritus or weight loss.  Last EGD: 02/28/2021, 3 tongues of salmon-colored mucosa suggestive  of Barrett's esophagus extending for 2 cm, confirmed with biopsies.  Normal stomach and small bowel. With normal biopsies of the small bowel.     Last Colonoscopy: 02/28/2021, normal colon with neg biopsies for microscopic colitis.  Past Medical History: Past Medical History:  Diagnosis Date   Anxiety    Barrett's esophagus    Cervical spine fracture (HCC)    Denies havign surgery    GERD (gastroesophageal reflux disease)    gestational hypertention    gestational hypertension   Helicobacter pylori (H. pylori)    IBS (irritable bowel syndrome)    Infertility, female     Past Surgical History: Past Surgical History:  Procedure Laterality Date   ABDOMINAL HYSTERECTOMY N/A 05/01/2022   Procedure: HYSTERECTOMY ABDOMINAL;  Surgeon: Lazaro Arms, MD;  Location: [facility];  Service: Gynecology;  Laterality: N/A;   BIOPSY  02/28/2021   Procedure: BIOPSY;  Surgeon: Dolores Frame, MD;  [facility];  Service: Gastroenterology;;   CESAREAN SECTION     COLONOSCOPY WITH PROPOFOL N/A 02/28/2021   Procedure: COLONOSCOPY WITH PROPOFOL;  Surgeon: Dolores Frame, MD;  [facility];  Service: Gastroenterology;  Laterality: N/A;  AM   DILATATION AND CURETTAGE/HYSTEROSCOPY WITH MINERVA N/A 08/22/2020   Procedure: DILATATION AND CURETTAGE/HYSTEROSCOPY WITH MINERVA;  Surgeon: Tilda Burrow, MD;  Location: [facility];  Service: Gynecology;  Laterality: N/A;   DILATION AND CURETTAGE OF UTERUS     x3   ESOPHAGOGASTRODUODENOSCOPY (EGD) WITH PROPOFOL N/A 02/28/2021   Procedure: ESOPHAGOGASTRODUODENOSCOPY (EGD) WITH PROPOFOL;  Surgeon: Dolores Frame,  MD;  [facility];  Service: Gastroenterology;  Laterality: N/A;   HALO APPLICATION     age 60   LAPAROSCOPIC BILATERAL SALPINGECTOMY Bilateral 08/22/2020   Procedure: LAPAROSCOPIC BILATERAL SALPINGECTOMY;  Surgeon: Tilda Burrow, MD;  Location: [facility];  Service: Gynecology;  Laterality: Bilateral;    OOPHORECTOMY Right 05/01/2022   Procedure: OOPHORECTOMY;  Surgeon: Lazaro Arms, MD;  Location: [facility];  Service: Gynecology;  Laterality: Right;    Family History: Family History  Problem Relation Age of Onset   Cancer Paternal Grandfather        pancreatic?   Cancer Paternal Grandmother        liver   Dementia Maternal Grandmother    Cancer Father        pancreatic   Cancer Mother        skin   Breast cancer Sister 22   Polycystic ovary syndrome Sister    Endometriosis Sister    Breast cancer Sister 58   Breast cancer Paternal Aunt     Social History: Social History   Tobacco Use  Smoking Status Former   Current packs/day: 0.00   Average packs/day: 1 pack/day for 15.0 years (15.0 ttl pk-yrs)   Types: Cigarettes   Start date: 01/31/1996   Quit date: 01/31/2011   Years since quitting: 12.9  Smokeless Tobacco Never   Social History   Substance and Sexual Activity  Alcohol Use Yes   Comment: occassional   Social History   Substance and Sexual Activity  Drug Use No    Allergies: Allergies  Allergen Reactions   Bee Venom Swelling    Medications: Current Outpatient Medications  Medication Sig Dispense Refill   atomoxetine (STRATTERA) 40 MG capsule Take 40 mg by mouth daily.     escitalopram (LEXAPRO) 20 MG tablet Take 20 mg by mouth daily.     hydrocortisone (ANUSOL-HC) 2.5 % rectal cream Place 1 Application rectally 2 (two) times daily. 30 g 1   ibuprofen (ADVIL) 800 MG tablet Take by mouth every 6 (six) hours as needed.     Lactobacillus-Inulin (CULTURELLE ADULT ULT BALANCE PO) Take 1 capsule by mouth daily.     LORazepam (ATIVAN) 0.5 MG tablet Take 0.5 mg by mouth daily at 6 (six) AM.     omeprazole (PRILOSEC) 40 MG capsule TAKE 1 CAPSULE BY MOUTH EVERY DAY BEFORE breakfast 90 capsule 3   ondansetron (ZOFRAN-ODT) 4 MG disintegrating tablet Take 4 mg by mouth every 8 (eight) hours as needed for nausea or vomiting.     TRULANCE 3 MG TABS TAKE ONE TABLET BY  MOUTH DAILY 90 tablet 2   No current facility-administered medications for this visit.    Review of Systems: GENERAL: negative for malaise, night sweats HEENT: No changes in hearing or vision, no nose bleeds or other nasal problems. NECK: Negative for lumps, goiter, pain and significant neck swelling RESPIRATORY: Negative for cough, wheezing CARDIOVASCULAR: Negative for chest pain, leg swelling, palpitations, orthopnea GI: SEE HPI MUSCULOSKELETAL: Negative for joint pain or swelling, back pain, and muscle pain. SKIN: Negative for lesions, rash PSYCH: Negative for sleep disturbance, mood disorder and recent psychosocial stressors. HEMATOLOGY Negative for prolonged bleeding, bruising easily, and swollen nodes. ENDOCRINE: Negative for cold or heat intolerance, polyuria, polydipsia and goiter. NEURO: negative for tremor, gait imbalance, syncope and seizures. The remainder of the review of systems is noncontributory.   Physical Exam: BP 123/86   Pulse (!) 106   Temp 97.8 F (36.6 C)  Ht 5\' 3"  (1.6 m)   Wt 197 lb 8 oz (89.6 kg)   LMP 07/28/2020 (Exact Date)   BMI 34.99 kg/m  GENERAL: The patient is AO x3, in no acute distress. HEENT: Head is normocephalic and atraumatic. EOMI are intact. Mouth is well hydrated and without lesions. NECK: Supple. No masses LUNGS: Clear to auscultation. No presence of rhonchi/wheezing/rales. Adequate chest expansion HEART: RRR, normal s1 and s2. ABDOMEN: Soft, nontender, no guarding, no peritoneal signs, and nondistended. BS +. No masses. EXTREMITIES: Without any cyanosis, clubbing, rash, lesions or edema. NEUROLOGIC: AOx3, no focal motor deficit. SKIN: no jaundice, no rashes  Imaging/Labs: as above  I personally reviewed and interpreted the available labs, imaging and endoscopic files.  Impression and Plan: Deborah Humphrey is a 48 y.o. 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   Patient has presented recurrent episodes of constipation despite taking Trulance and low-dose MiraLAX on a regular basis.  I advised her to increase the MiraLAX intake to achieve more regular bowel movements.  I also explained that ideally this should be evaluated with an anorectal manometry as she has been refractory to prescription laxatives.  She would like to hold off on this, but will let us know if interested in proceeding with this if she fails medical treatment.  Regarding her GERD, it seems that this has been partially controlled with the use of omeprazole.  For unknown reasons she never started famotidine.  I advised her to start this medication as this may also help with dry heaving episodes she is presenting.  Finally, I encouraged her to discuss again with her PCP the possibility of stopping atomoxetine, as this could be leading to her dry heaving episodes.  Unfortunately, she will not be a candidate for TIF as she has a history of Barrett's esophagus.  We discussed the potentially by proceeding with this there could be some acid exposure in the areas of Barrett's esophagus when the esophagus becomes intra-abdominal, which may potentially increase the risk of progression to esophageal cancer.  -Continue Trulance 3 mg every day -Increase intake of MiraLAX 2-3 capfuls every day -If presenting recurrent straining when defecating, please reconsider proceeding with anorectal manometry at [facility] -Continue omeprazole 40 mg every day -Start famotidine 40 mg at bedtime  All questions were answered.      Deborah Blazing, MD Gastroenterology and Hepatology [facility] Gastroenterology

## 2024-01-02 ENCOUNTER — Ambulatory Visit (INDEPENDENT_AMBULATORY_CARE_PROVIDER_SITE_OTHER): Payer: 59 | Admitting: Gastroenterology

## 2024-02-11 IMAGING — MG MM DIGITAL SCREENING BILAT W/ TOMO AND CAD
8 series · 8 of 24 positions shown · non-contrast
Comparison: Previous exam(s).

CLINICAL DATA: Screening.

EXAM:
DIGITAL SCREENING BILATERAL MAMMOGRAM WITH TOMOSYNTHESIS AND CAD
TECHNIQUE: Bilateral screening digital craniocaudal and mediolateral oblique
mammograms were obtained. Bilateral screening digital breast
tomosynthesis was performed. The images were evaluated with
computer-aided detection.

[L MLO synth-2D]
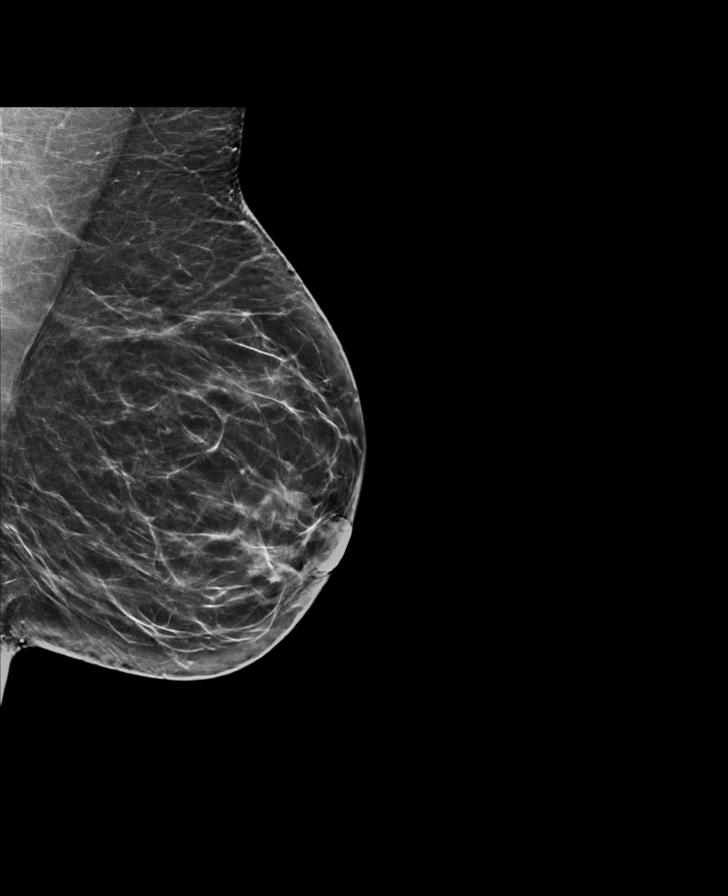

[R CC synth-2D]
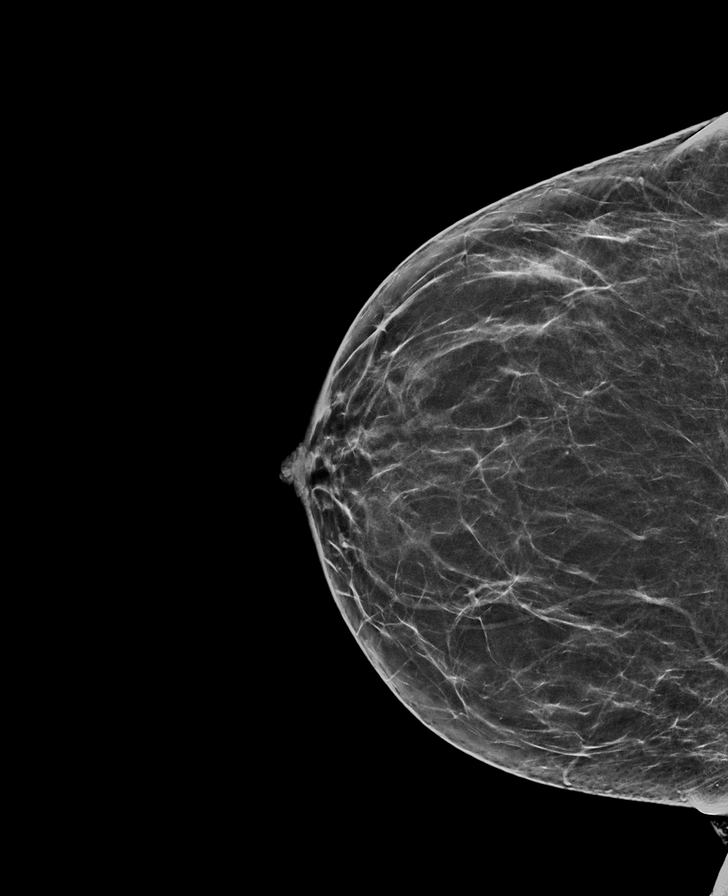

[L CC synth-2D]
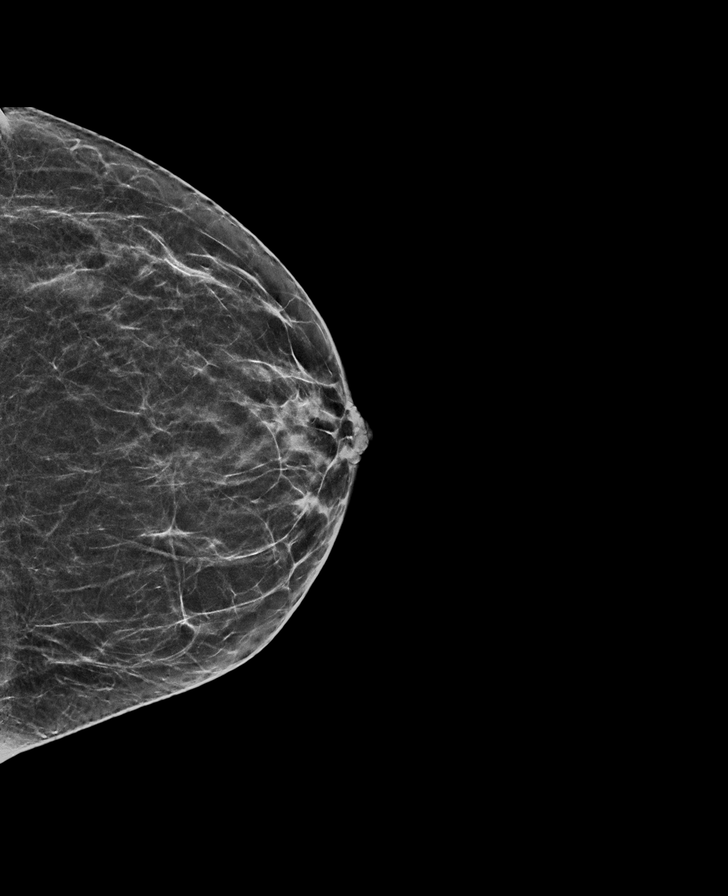

[R MLO synth-2D]
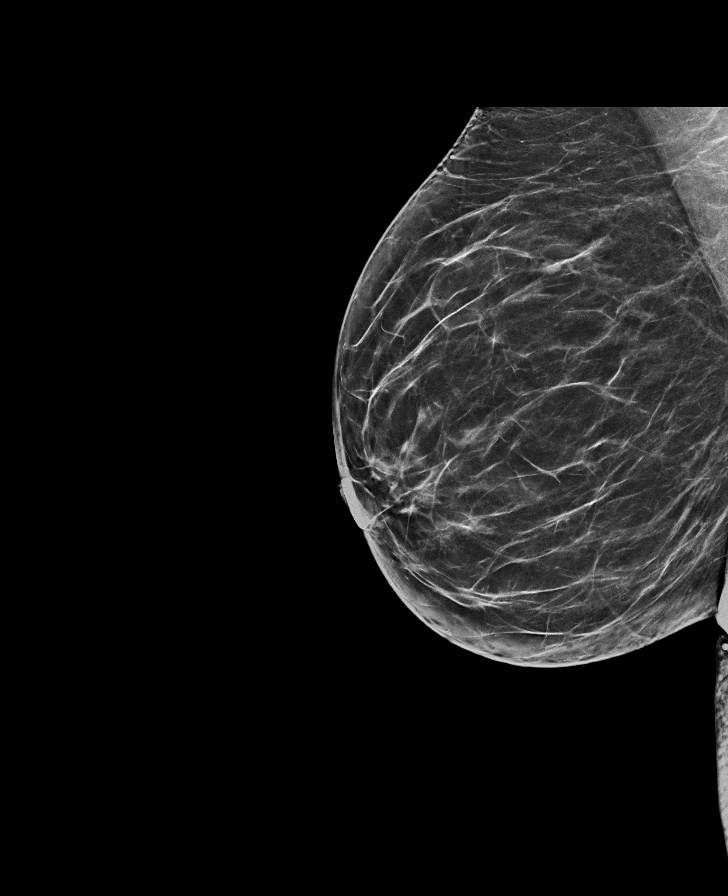

[R MLO tomo · tomo slice 36/71.0]
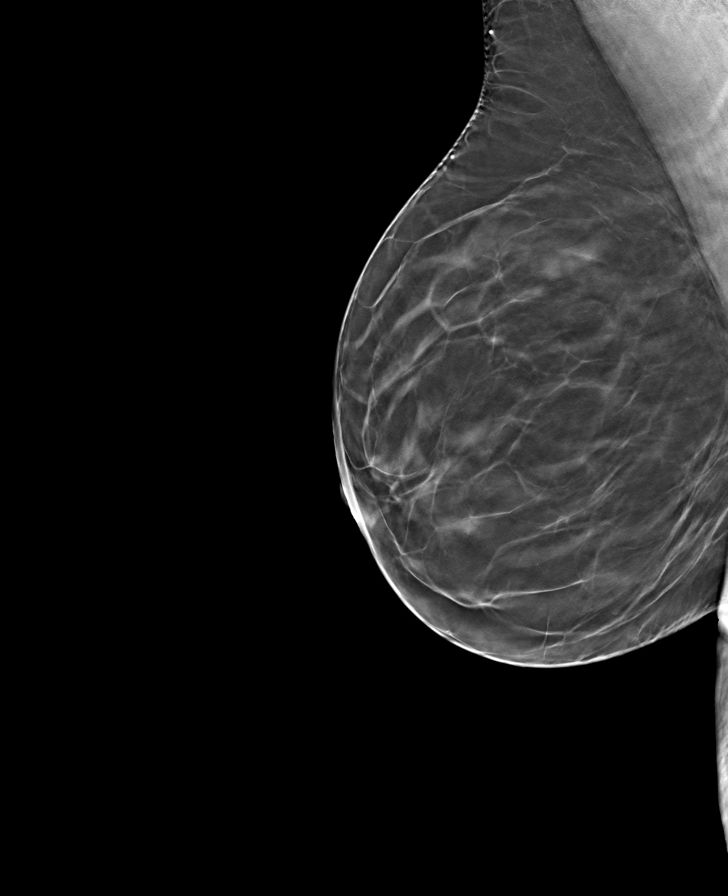

[L MLO tomo · tomo slice 35/68.0]
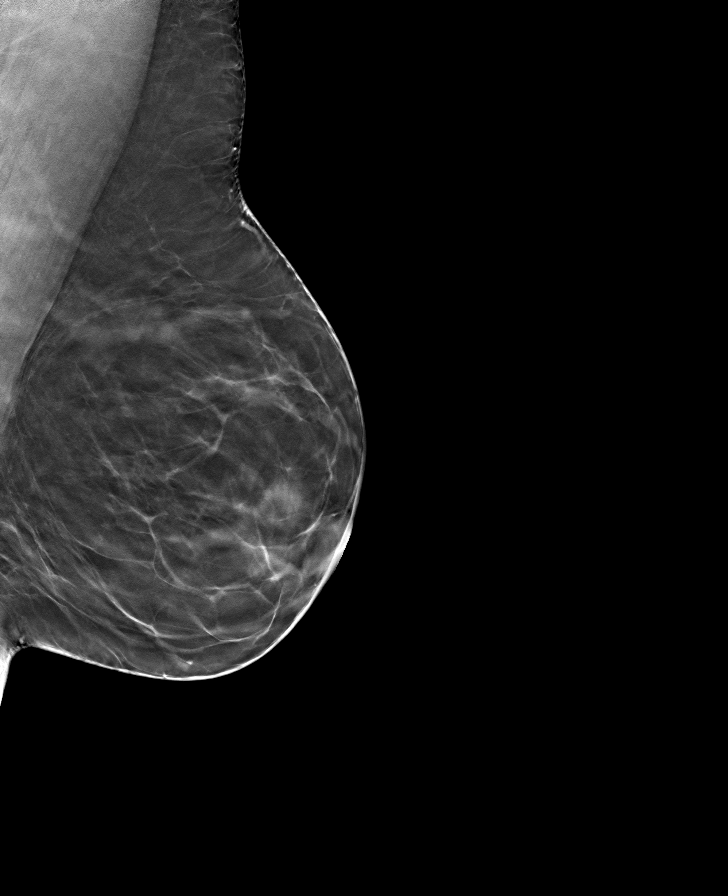

[L CC tomo · tomo slice 29/56.0]
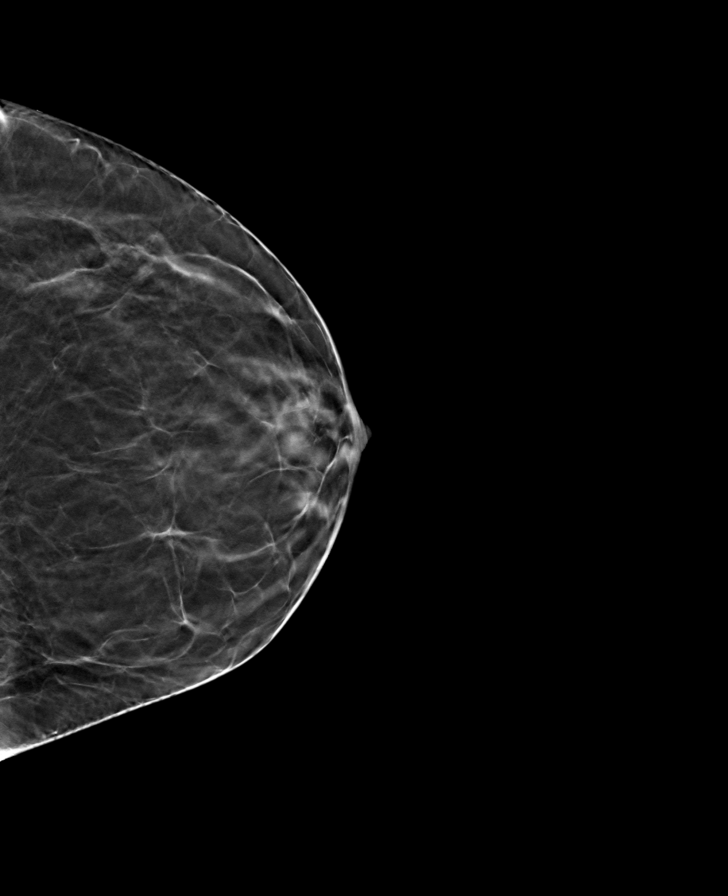

[R CC tomo · tomo slice 31/62.0]
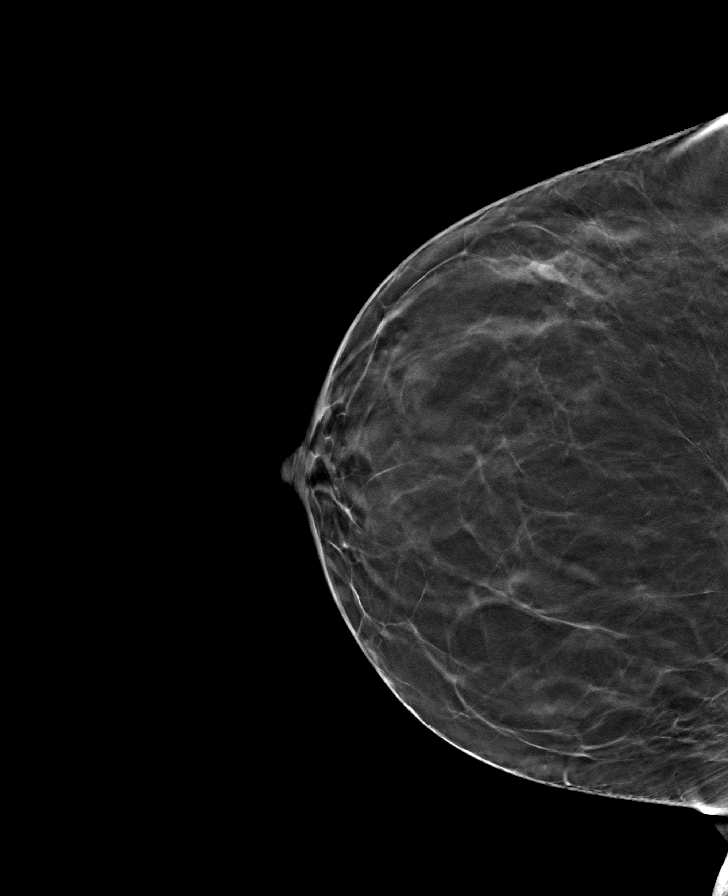

[8 of 24 positions shown; findings below may reference images not displayed]

ACR Breast Density Category b: There are scattered areas of
fibroglandular density.
FINDINGS: There are no findings suspicious for malignancy.
IMPRESSION: No mammographic evidence of malignancy. A result letter of this
screening mammogram will be mailed directly to the patient.

RECOMMENDATION:
Screening mammogram in one year. (Code:51-O-LD2)

BI-RADS CATEGORY  1: Negative.

## 2024-02-20 ENCOUNTER — Encounter (INDEPENDENT_AMBULATORY_CARE_PROVIDER_SITE_OTHER): Payer: Self-pay | Admitting: *Deleted

## 2024-02-26 DIAGNOSIS — L304 Erythema intertrigo: Secondary | ICD-10-CM | POA: Diagnosis not present

## 2024-02-26 DIAGNOSIS — F988 Other specified behavioral and emotional disorders with onset usually occurring in childhood and adolescence: Secondary | ICD-10-CM | POA: Diagnosis not present

## 2024-02-26 DIAGNOSIS — Z6834 Body mass index (BMI) 34.0-34.9, adult: Secondary | ICD-10-CM | POA: Diagnosis not present

## 2024-02-26 DIAGNOSIS — Z8659 Personal history of other mental and behavioral disorders: Secondary | ICD-10-CM | POA: Diagnosis not present

## 2024-02-26 DIAGNOSIS — F411 Generalized anxiety disorder: Secondary | ICD-10-CM | POA: Diagnosis not present

## 2024-03-25 DIAGNOSIS — Z6834 Body mass index (BMI) 34.0-34.9, adult: Secondary | ICD-10-CM | POA: Diagnosis not present

## 2024-03-25 DIAGNOSIS — F988 Other specified behavioral and emotional disorders with onset usually occurring in childhood and adolescence: Secondary | ICD-10-CM | POA: Diagnosis not present

## 2024-04-04 ENCOUNTER — Other Ambulatory Visit (INDEPENDENT_AMBULATORY_CARE_PROVIDER_SITE_OTHER): Payer: Self-pay | Admitting: Gastroenterology

## 2024-04-04 DIAGNOSIS — K219 Gastro-esophageal reflux disease without esophagitis: Secondary | ICD-10-CM

## 2024-04-04 DIAGNOSIS — K2270 Barrett's esophagus without dysplasia: Secondary | ICD-10-CM

## 2024-04-06 ENCOUNTER — Encounter (INDEPENDENT_AMBULATORY_CARE_PROVIDER_SITE_OTHER): Payer: Self-pay | Admitting: Gastroenterology

## 2024-04-20 NOTE — Progress Notes (Addendum)
 Referring Provider: Jolynn Needy, PA Primary Care Physician:  Jolynn Needy, PA Primary GI Physician: Dr. Sammi Crick  Chief Complaint  Patient presents with   Follow-up    Follow up. Has nausea and vomiting daily. Having issues with IBS-C. GERD has been acting up.     HPI:   Deborah Humphrey is a 48 y.o. 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   Last seen in the office 01/01/2024 reporting she had been doing well until the last couple of weeks when she had exacerbation of dry heaving and couple episodes of vomiting.  Constipation had also been worse, having BMs daily, but having to strain with incomplete emptying.  She is taking Trulance  3 mg daily and MiraLAX 1 capful daily.  Some associated abdominal pain when having constipation.  Recommended continuing Trulance  3 mg daily, increase MiraLAX to 2-3 capfuls daily, if recurrent straining with defecation, reconsider anorectal manometry as she had refractory symptoms to prescription laxatives, continue omeprazole  40 mg daily, start famotidine  40 mg at bedtime.  Also encouraged patient to discuss stopping atomoxetine with PCP as this could also be leading to dry heaving episodes.   Today:  Nausea/vomiting:  Stopped strattera and started Adderall. Nausea/vomiting is worse.  Symptoms are mostly in the morning, but sometimes throughout the day.  Can be as soon as her feet hit the floor, or at random.  Sometimes after eating.  Vomiting 4 to 5 days a week, usually just acid or bile.  Has a lot of belching.  Not specifically with heartburn.  Some early satiety and decreased appetite as well.  Somewhat epigastric discomfort.  Can worsen postprandially.  No specific dietary triggers.  Losing weight unintentionally.  Rare NSAID use.  Wine occasionally. Has cut back on this. About 2-3 nights a week.  Has cut back on caffeine. Quit coffee.  Sleeping  on a wedge. Does not eat within 3 hours of going to bed. No drug use.   Zofran  not helpful.   Intermittent dysphagia. Moreso with pills.     IBS-C:  States Trulance  really helps with the abdominal pain that she does really have any lower abdominal pain at this time, but still having constipation.  Has several bowel movements a day, but just small amounts.  Does not take MiraLAX regularly.  States this is not her primary concern right now.  No BRBPR or melena.    Last EGD: 02/28/2021, 3 tongues of salmon-colored mucosa suggestive of Barrett's esophagus extending for 2 cm, confirmed with biopsies.  Normal stomach and small bowel. With normal biopsies of the small bowel.  Recommended 3 year surveillance.    Last Colonoscopy: 02/28/2021, normal colon with neg biopsies for microscopic colitis.    Past Medical History:  Diagnosis Date   Anxiety    Barrett's esophagus    Cervical spine fracture (HCC)    Denies havign surgery    GERD (gastroesophageal reflux disease)    gestational hypertention    gestational hypertension   Helicobacter pylori (H. pylori)    IBS (irritable bowel syndrome)    Infertility, female     Past Surgical History:  Procedure Laterality Date   ABDOMINAL HYSTERECTOMY N/A 05/01/2022   Procedure: HYSTERECTOMY ABDOMINAL;  Surgeon: Wendelyn Halter, MD;  Location: [facility];  Service: Gynecology;  Laterality: N/A;   BIOPSY  02/28/2021   Procedure: BIOPSY;  Surgeon: Urban Garden, MD;  Location: [facility];  Service: Gastroenterology;;   CESAREAN SECTION     COLONOSCOPY WITH PROPOFOL  N/A 02/28/2021   Procedure: COLONOSCOPY WITH PROPOFOL ;  Surgeon: Urban Garden, MD;  Location: [facility];  Service: Gastroenterology;  Laterality: N/A;  AM   DILATATION AND CURETTAGE/HYSTEROSCOPY WITH MINERVA N/A 08/22/2020   Procedure: DILATATION AND CURETTAGE/HYSTEROSCOPY WITH MINERVA;  Surgeon: Albino Hum, MD;  Location: [facility];  Service: Gynecology;   Laterality: N/A;   DILATION AND CURETTAGE OF UTERUS     x3   ESOPHAGOGASTRODUODENOSCOPY (EGD) WITH PROPOFOL  N/A 02/28/2021   Procedure: ESOPHAGOGASTRODUODENOSCOPY (EGD) WITH PROPOFOL ;  Surgeon: Urban Garden, MD;  Location: [facility];  Service: Gastroenterology;  Laterality: N/A;   HALO APPLICATION     age 21   LAPAROSCOPIC BILATERAL SALPINGECTOMY Bilateral 08/22/2020   Procedure: LAPAROSCOPIC BILATERAL SALPINGECTOMY;  Surgeon: Albino Hum, MD;  Location: [facility];  Service: Gynecology;  Laterality: Bilateral;   OOPHORECTOMY Right 05/01/2022   Procedure: OOPHORECTOMY;  Surgeon: Wendelyn Halter, MD;  Location: [facility];  Service: Gynecology;  Laterality: Right;    Current Outpatient Medications  Medication Sig Dispense Refill   amphetamine-dextroamphetamine (ADDERALL XR) 5 MG 24 hr capsule Take 5 mg by mouth every morning.     escitalopram (LEXAPRO) 20 MG tablet Take 20 mg by mouth daily.     famotidine  (PEPCID ) 40 MG tablet Take 1 tablet (40 mg total) by mouth daily. 90 tablet 3   hydrocortisone  (ANUSOL -HC) 2.5 % rectal cream Place 1 Application rectally 2 (two) times daily. 30 g 1   ibuprofen (ADVIL) 800 MG tablet Take by mouth every 6 (six) hours as needed.     Lactobacillus-Inulin (CULTURELLE ADULT ULT BALANCE PO) Take 1 capsule by mouth daily.     LORazepam (ATIVAN) 0.5 MG tablet Take 0.5 mg by mouth daily at 6 (six) AM.     omeprazole  (PRILOSEC) 40 MG capsule Take 1 capsule (40 mg total) by mouth 2 (two) times daily before a meal. 60 capsule 3   ondansetron  (ZOFRAN -ODT) 4 MG disintegrating tablet Take 4 mg by mouth every 8 (eight) hours as needed for nausea or vomiting.     promethazine  (PHENERGAN ) 25 MG tablet Take 1 tablet (25 mg total) by mouth every 8 (eight) hours as needed for nausea or vomiting. 30 tablet 1   TRULANCE  3 MG TABS TAKE ONE TABLET BY MOUTH DAILY 90 tablet 2   No current facility-administered medications for this visit.    Allergies as of 04/22/2024  - Review Complete 04/22/2024  Allergen Reaction Noted   Bee venom Swelling 05/21/2012    Family History  Problem Relation Age of Onset   Cancer Paternal Grandfather        pancreatic?   Cancer Paternal Grandmother        liver   Dementia Maternal Grandmother    Cancer Father        pancreatic   Cancer Mother        skin   Breast cancer Sister 46   Polycystic ovary syndrome Sister    Endometriosis Sister    Breast cancer Sister 3   Breast cancer Paternal Aunt     Social History   Socioeconomic History   Marital status: Married    Spouse name: Not on file   Number of children: 1   Years of education: Not on file   Highest education level: Not on file  Occupational History   Not on file  Tobacco Use  Smoking status: Former    Current packs/day: 0.00    Average packs/day: 1 pack/day for 15.0 years (15.0 ttl pk-yrs)    Types: Cigarettes    Start date: 01/31/1996    Quit date: 01/31/2011    Years since quitting: 13.2   Smokeless tobacco: Never  Vaping Use   Vaping status: Never Used  Substance and Sexual Activity   Alcohol use: Yes    Comment: occassional   Drug use: No   Sexual activity: Yes    Birth control/protection: Surgical    Comment: hyst  Other Topics Concern   Not on file  Social History Narrative   Not on file   Social Drivers of Health   Financial Resource Strain: Low Risk  (02/14/2023)   Overall Financial Resource Strain (CARDIA)    Difficulty of Paying Living Expenses: Not hard at all  Food Insecurity: No Food Insecurity (02/14/2023)   Hunger Vital Sign    Worried About Running Out of Food in the Last Year: Never true    Ran Out of Food in the Last Year: Never true  Transportation Needs: No Transportation Needs (02/14/2023)   PRAPARE - Administrator, Civil Service (Medical): No    Lack of Transportation (Non-Medical): No  Physical Activity: Insufficiently Active (02/14/2023)   Exercise Vital Sign    Days of Exercise per Week: 2 days     Minutes of Exercise per Session: 30 min  Stress: Stress Concern Present (02/14/2023)   [employer] of Occupational Health - Occupational Stress Questionnaire    Feeling of Stress : To some extent  Social Connections: Socially Integrated (02/14/2023)   Social Connection and Isolation Panel [NHANES]    Frequency of Communication with Friends and Family: More than three times a week    Frequency of Social Gatherings with Friends and Family: Twice a week    Attends Religious Services: More than 4 times per year    Active Member of [employer] or Organizations: Yes    Attends Engineer, structural: More than 4 times per year    Marital Status: Married    Review of Systems: Gen: Denies fever, chills, cold or flulike symptoms, presyncope, syncope. CV: Denies chest pain, palpitations. Resp: Denies dyspnea, cough. GI: See HPI Heme: See HPI  Physical Exam: BP 138/88 (BP Location: Right Arm, Patient Position: Sitting, Cuff Size: Normal)   Pulse 76   Temp 97.7 F (36.5 C) (Temporal)   Ht 5' 3 (1.6 m)   Wt 187 lb 9.6 oz (85.1 kg)   LMP 07/28/2020 (Exact Date)   BMI 33.23 kg/m  General:   Alert and oriented. No distress noted. Pleasant and cooperative.  Head:  Normocephalic and atraumatic. Eyes:  Conjuctiva clear without scleral icterus. Heart:  S1, S2 present without murmurs appreciated. Lungs:  Clear to auscultation bilaterally. No wheezes, rales, or rhonchi. No distress.  Abdomen:  +BS, soft, and non-distended. Mild epigastric TTP. No rebound or guarding. No HSM or masses noted. Msk:  Symmetrical without gross deformities. Normal posture. Extremities:  Without edema. Neurologic:  Alert and  oriented x4 Psych:  Normal mood and affect.    Assessment:  48 year old female with history of IBS-C, GERD, Barrett's esophagus, anxiety, OCD, presenting today for follow-up of nausea and vomiting.  Nausea/vomiting/early satiety/weight loss: Intermittent, frequent nausea/vomiting  for the last 6+ months.  Also reporting early satiety, lack of appetite, weight loss, increased belching more recently.  Initially felt symptoms may be secondary to Strattera.  This has  been discontinued and she was started on Adderall and reports symptoms are worse.  Nausea and anorexia are potential side effects of Adderall; however, considering severity, frequency of symptoms, weight loss, and mild epigastric discomfort, I have recommended updating an EGD to evaluate for gastritis, duodenitis, PUD, H. pylori, or possible change in Barrett's epithelium as her last EGD was in 2022.  I am also recommending that she increase omeprazole  to 40 mg twice a day for now.  If unrevealing, likely needs to consider stopping Adderall and monitoring for resolution of symptoms.  GERD:  Typical heartburn well-controlled, but notes increased belching/indigestion despite making dietary changes, sleeping elevated.  Will increase omeprazole  to 40 mg twice daily.   Dysphagia: Intermittent pill dysphagia.  Planning for EGD in the near future.  Will add esophageal dilation as appropriate.  History of Barrett's esophagus: EGD 02/28/2021 with Barrett's esophagus extending for 2 cm.  Initial recommendation was to repeat EGD in 3 years.  As Barrett's esophagus is short segment, could technically repeat in 5 years, but due to frequent nausea and vomiting as per above, will repeat EGD now.  IBS-C:  Currently taking Trulance  3 mg daily which has helped with her lower abdominal pain, but still having issues with constipation.  She prefers to stay on Trulance .  Recommended adding MiraLAX 1-2 times every day.    Plan:  Initially ordered labs, but patient later stated [facility].  We will request these. Proceed with upper endoscopy +/- dilation with propofol  by Dr. Sammi Crick in the near future. The risks, benefits, and alternatives have been discussed with the patient in detail. The patient states  understanding and desires to proceed.  ASA 2 UPT Increase omeprazole  to 40 mg twice daily. Start Phenergan  25 mg every 8 hours as needed as Zofran  is not helpful. Counseled on potential for drowsiness.  Follow a GERD diet:  Avoid fried, fatty, greasy, spicy, citrus foods. Avoid caffeine and carbonated beverages. Avoid chocolate. Try eating 4-6 small meals a day rather than 3 large meals. Do not eat within 3 hours of laying down. Prop head of bed up on wood or bricks to create a 6 inch incline. Continue Trulance  3 mg daily. Add MiraLAX 1 capful 1-2 times daily. Follow-up after EGD.  If patient has persistent nausea/vomiting with no significant abnormalities on EGD, would recommend holding Adderall and monitoring for resolution of symptoms.   Shana Daring, PA-C [facility] Gastroenterology 04/22/2024   I have reviewed the note and agree with the APP's assessment as described in this progress note  Samantha Cress, MD Gastroenterology and Hepatology [facility] Gastroenterology

## 2024-04-20 NOTE — H&P (View-Only) (Signed)
 Referring Provider: Jolynn Needy, PA Primary Care Physician:  Jolynn Needy, PA Primary GI Physician: Dr. Sammi Crick  Chief Complaint  Patient presents with   Follow-up    Follow up. Has nausea and vomiting daily. Having issues with IBS-C. GERD has been acting up.     HPI:   Deborah Humphrey is a 48 y.o. 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   Last seen in the office 01/01/2024 reporting she had been doing well until the last couple of weeks when she had exacerbation of dry heaving and couple episodes of vomiting.  Constipation had also been worse, having BMs daily, but having to strain with incomplete emptying.  She is taking Trulance  3 mg daily and MiraLAX 1 capful daily.  Some associated abdominal pain when having constipation.  Recommended continuing Trulance  3 mg daily, increase MiraLAX to 2-3 capfuls daily, if recurrent straining with defecation, reconsider anorectal manometry as she had refractory symptoms to prescription laxatives, continue omeprazole  40 mg daily, start famotidine  40 mg at bedtime.  Also encouraged patient to discuss stopping atomoxetine with PCP as this could also be leading to dry heaving episodes.   Today:  Nausea/vomiting:  Stopped strattera and started Adderall. Nausea/vomiting is worse.  Symptoms are mostly in the morning, but sometimes throughout the day.  Can be as soon as her feet hit the floor, or at random.  Sometimes after eating.  Vomiting 4 to 5 days a week, usually just acid or bile.  Has a lot of belching.  Not specifically with heartburn.  Some early satiety and decreased appetite as well.  Somewhat epigastric discomfort.  Can worsen postprandially.  No specific dietary triggers.  Losing weight unintentionally.  Rare NSAID use.  Wine occasionally. Has cut back on this. About 2-3 nights a week.  Has cut back on caffeine. Quit coffee.  Sleeping  on a wedge. Does not eat within 3 hours of going to bed. No drug use.   Zofran  not helpful.   Intermittent dysphagia. Moreso with pills.     IBS-C:  States Trulance  really helps with the abdominal pain that she does really have any lower abdominal pain at this time, but still having constipation.  Has several bowel movements a day, but just small amounts.  Does not take MiraLAX regularly.  States this is not her primary concern right now.  No BRBPR or melena.    Last EGD: 02/28/2021, 3 tongues of salmon-colored mucosa suggestive of Barrett's esophagus extending for 2 cm, confirmed with biopsies.  Normal stomach and small bowel. With normal biopsies of the small bowel.  Recommended 3 year surveillance.    Last Colonoscopy: 02/28/2021, normal colon with neg biopsies for microscopic colitis.    Past Medical History:  Diagnosis Date   Anxiety    Barrett's esophagus    Cervical spine fracture (HCC)    Denies havign surgery    GERD (gastroesophageal reflux disease)    gestational hypertention    gestational hypertension   Helicobacter pylori (H. pylori)    IBS (irritable bowel syndrome)    Infertility, female     Past Surgical History:  Procedure Laterality Date   ABDOMINAL HYSTERECTOMY N/A 05/01/2022   Procedure: HYSTERECTOMY ABDOMINAL;  Surgeon: Wendelyn Halter, MD;  Location: [facility];  Service: Gynecology;  Laterality: N/A;   BIOPSY  02/28/2021   Procedure: BIOPSY;  Surgeon: Urban Garden, MD;  Location: [facility];  Service: Gastroenterology;;   CESAREAN SECTION     COLONOSCOPY WITH PROPOFOL  N/A 02/28/2021   Procedure: COLONOSCOPY WITH PROPOFOL ;  Surgeon: Urban Garden, MD;  Location: [facility];  Service: Gastroenterology;  Laterality: N/A;  AM   DILATATION AND CURETTAGE/HYSTEROSCOPY WITH MINERVA N/A 08/22/2020   Procedure: DILATATION AND CURETTAGE/HYSTEROSCOPY WITH MINERVA;  Surgeon: Albino Hum, MD;  Location: [facility];  Service: Gynecology;   Laterality: N/A;   DILATION AND CURETTAGE OF UTERUS     x3   ESOPHAGOGASTRODUODENOSCOPY (EGD) WITH PROPOFOL  N/A 02/28/2021   Procedure: ESOPHAGOGASTRODUODENOSCOPY (EGD) WITH PROPOFOL ;  Surgeon: Urban Garden, MD;  Location: [facility];  Service: Gastroenterology;  Laterality: N/A;   HALO APPLICATION     age 21   LAPAROSCOPIC BILATERAL SALPINGECTOMY Bilateral 08/22/2020   Procedure: LAPAROSCOPIC BILATERAL SALPINGECTOMY;  Surgeon: Albino Hum, MD;  Location: [facility];  Service: Gynecology;  Laterality: Bilateral;   OOPHORECTOMY Right 05/01/2022   Procedure: OOPHORECTOMY;  Surgeon: Wendelyn Halter, MD;  Location: [facility];  Service: Gynecology;  Laterality: Right;    Current Outpatient Medications  Medication Sig Dispense Refill   amphetamine-dextroamphetamine (ADDERALL XR) 5 MG 24 hr capsule Take 5 mg by mouth every morning.     escitalopram (LEXAPRO) 20 MG tablet Take 20 mg by mouth daily.     famotidine  (PEPCID ) 40 MG tablet Take 1 tablet (40 mg total) by mouth daily. 90 tablet 3   hydrocortisone  (ANUSOL -HC) 2.5 % rectal cream Place 1 Application rectally 2 (two) times daily. 30 g 1   ibuprofen (ADVIL) 800 MG tablet Take by mouth every 6 (six) hours as needed.     Lactobacillus-Inulin (CULTURELLE ADULT ULT BALANCE PO) Take 1 capsule by mouth daily.     LORazepam (ATIVAN) 0.5 MG tablet Take 0.5 mg by mouth daily at 6 (six) AM.     omeprazole  (PRILOSEC) 40 MG capsule Take 1 capsule (40 mg total) by mouth 2 (two) times daily before a meal. 60 capsule 3   ondansetron  (ZOFRAN -ODT) 4 MG disintegrating tablet Take 4 mg by mouth every 8 (eight) hours as needed for nausea or vomiting.     promethazine  (PHENERGAN ) 25 MG tablet Take 1 tablet (25 mg total) by mouth every 8 (eight) hours as needed for nausea or vomiting. 30 tablet 1   TRULANCE  3 MG TABS TAKE ONE TABLET BY MOUTH DAILY 90 tablet 2   No current facility-administered medications for this visit.    Allergies as of 04/22/2024  - Review Complete 04/22/2024  Allergen Reaction Noted   Bee venom Swelling 05/21/2012    Family History  Problem Relation Age of Onset   Cancer Paternal Grandfather        pancreatic?   Cancer Paternal Grandmother        liver   Dementia Maternal Grandmother    Cancer Father        pancreatic   Cancer Mother        skin   Breast cancer Sister 46   Polycystic ovary syndrome Sister    Endometriosis Sister    Breast cancer Sister 3   Breast cancer Paternal Aunt     Social History   Socioeconomic History   Marital status: Married    Spouse name: Not on file   Number of children: 1   Years of education: Not on file   Highest education level: Not on file  Occupational History   Not on file  Tobacco Use  Smoking status: Former    Current packs/day: 0.00    Average packs/day: 1 pack/day for 15.0 years (15.0 ttl pk-yrs)    Types: Cigarettes    Start date: 01/31/1996    Quit date: 01/31/2011    Years since quitting: 13.2   Smokeless tobacco: Never  Vaping Use   Vaping status: Never Used  Substance and Sexual Activity   Alcohol use: Yes    Comment: occassional   Drug use: No   Sexual activity: Yes    Birth control/protection: Surgical    Comment: hyst  Other Topics Concern   Not on file  Social History Narrative   Not on file   Social Drivers of Health   Financial Resource Strain: Low Risk  (02/14/2023)   Overall Financial Resource Strain (CARDIA)    Difficulty of Paying Living Expenses: Not hard at all  Food Insecurity: No Food Insecurity (02/14/2023)   Hunger Vital Sign    Worried About Running Out of Food in the Last Year: Never true    Ran Out of Food in the Last Year: Never true  Transportation Needs: No Transportation Needs (02/14/2023)   PRAPARE - Administrator, Civil Service (Medical): No    Lack of Transportation (Non-Medical): No  Physical Activity: Insufficiently Active (02/14/2023)   Exercise Vital Sign    Days of Exercise per Week: 2 days     Minutes of Exercise per Session: 30 min  Stress: Stress Concern Present (02/14/2023)   [employer] of Occupational Health - Occupational Stress Questionnaire    Feeling of Stress : To some extent  Social Connections: Socially Integrated (02/14/2023)   Social Connection and Isolation Panel [NHANES]    Frequency of Communication with Friends and Family: More than three times a week    Frequency of Social Gatherings with Friends and Family: Twice a week    Attends Religious Services: More than 4 times per year    Active Member of [employer] or Organizations: Yes    Attends Engineer, structural: More than 4 times per year    Marital Status: Married    Review of Systems: Gen: Denies fever, chills, cold or flulike symptoms, presyncope, syncope. CV: Denies chest pain, palpitations. Resp: Denies dyspnea, cough. GI: See HPI Heme: See HPI  Physical Exam: BP 138/88 (BP Location: Right Arm, Patient Position: Sitting, Cuff Size: Normal)   Pulse 76   Temp 97.7 F (36.5 C) (Temporal)   Ht 5' 3 (1.6 m)   Wt 187 lb 9.6 oz (85.1 kg)   LMP 07/28/2020 (Exact Date)   BMI 33.23 kg/m  General:   Alert and oriented. No distress noted. Pleasant and cooperative.  Head:  Normocephalic and atraumatic. Eyes:  Conjuctiva clear without scleral icterus. Heart:  S1, S2 present without murmurs appreciated. Lungs:  Clear to auscultation bilaterally. No wheezes, rales, or rhonchi. No distress.  Abdomen:  +BS, soft, and non-distended. Mild epigastric TTP. No rebound or guarding. No HSM or masses noted. Msk:  Symmetrical without gross deformities. Normal posture. Extremities:  Without edema. Neurologic:  Alert and  oriented x4 Psych:  Normal mood and affect.    Assessment:  48 year old female with history of IBS-C, GERD, Barrett's esophagus, anxiety, OCD, presenting today for follow-up of nausea and vomiting.  Nausea/vomiting/early satiety/weight loss: Intermittent, frequent nausea/vomiting  for the last 6+ months.  Also reporting early satiety, lack of appetite, weight loss, increased belching more recently.  Initially felt symptoms may be secondary to Strattera.  This has  been discontinued and she was started on Adderall and reports symptoms are worse.  Nausea and anorexia are potential side effects of Adderall; however, considering severity, frequency of symptoms, weight loss, and mild epigastric discomfort, I have recommended updating an EGD to evaluate for gastritis, duodenitis, PUD, H. pylori, or possible change in Barrett's epithelium as her last EGD was in 2022.  I am also recommending that she increase omeprazole  to 40 mg twice a day for now.  If unrevealing, likely needs to consider stopping Adderall and monitoring for resolution of symptoms.  GERD:  Typical heartburn well-controlled, but notes increased belching/indigestion despite making dietary changes, sleeping elevated.  Will increase omeprazole  to 40 mg twice daily.   Dysphagia: Intermittent pill dysphagia.  Planning for EGD in the near future.  Will add esophageal dilation as appropriate.  History of Barrett's esophagus: EGD 02/28/2021 with Barrett's esophagus extending for 2 cm.  Initial recommendation was to repeat EGD in 3 years.  As Barrett's esophagus is short segment, could technically repeat in 5 years, but due to frequent nausea and vomiting as per above, will repeat EGD now.  IBS-C:  Currently taking Trulance  3 mg daily which has helped with her lower abdominal pain, but still having issues with constipation.  She prefers to stay on Trulance .  Recommended adding MiraLAX 1-2 times every day.    Plan:  Initially ordered labs, but patient later stated [facility].  We will request these. Proceed with upper endoscopy +/- dilation with propofol  by Dr. Sammi Crick in the near future. The risks, benefits, and alternatives have been discussed with the patient in detail. The patient states  understanding and desires to proceed.  ASA 2 UPT Increase omeprazole  to 40 mg twice daily. Start Phenergan  25 mg every 8 hours as needed as Zofran  is not helpful. Counseled on potential for drowsiness.  Follow a GERD diet:  Avoid fried, fatty, greasy, spicy, citrus foods. Avoid caffeine and carbonated beverages. Avoid chocolate. Try eating 4-6 small meals a day rather than 3 large meals. Do not eat within 3 hours of laying down. Prop head of bed up on wood or bricks to create a 6 inch incline. Continue Trulance  3 mg daily. Add MiraLAX 1 capful 1-2 times daily. Follow-up after EGD.  If patient has persistent nausea/vomiting with no significant abnormalities on EGD, would recommend holding Adderall and monitoring for resolution of symptoms.   Deborah Daring, PA-C [facility] Gastroenterology 04/22/2024   I have reviewed the note and agree with the APP's assessment as described in this progress note  Samantha Cress, MD Gastroenterology and Hepatology [facility] Gastroenterology

## 2024-04-22 ENCOUNTER — Encounter: Payer: Self-pay | Admitting: Gastroenterology

## 2024-04-22 ENCOUNTER — Encounter: Payer: Self-pay | Admitting: *Deleted

## 2024-04-22 ENCOUNTER — Ambulatory Visit: Admitting: Gastroenterology

## 2024-04-22 VITALS — BP 138/88 | HR 76 | Temp 97.7°F | Ht 63.0 in | Wt 187.6 lb

## 2024-04-22 DIAGNOSIS — R634 Abnormal weight loss: Secondary | ICD-10-CM

## 2024-04-22 DIAGNOSIS — R112 Nausea with vomiting, unspecified: Secondary | ICD-10-CM | POA: Diagnosis not present

## 2024-04-22 DIAGNOSIS — R6881 Early satiety: Secondary | ICD-10-CM | POA: Diagnosis not present

## 2024-04-22 DIAGNOSIS — Z8719 Personal history of other diseases of the digestive system: Secondary | ICD-10-CM

## 2024-04-22 DIAGNOSIS — R1310 Dysphagia, unspecified: Secondary | ICD-10-CM

## 2024-04-22 DIAGNOSIS — K219 Gastro-esophageal reflux disease without esophagitis: Secondary | ICD-10-CM

## 2024-04-22 DIAGNOSIS — K581 Irritable bowel syndrome with constipation: Secondary | ICD-10-CM

## 2024-04-22 DIAGNOSIS — K2270 Barrett's esophagus without dysplasia: Secondary | ICD-10-CM

## 2024-04-22 MED ORDER — OMEPRAZOLE 40 MG PO CPDR: 40.0000 mg | DELAYED_RELEASE_CAPSULE | Freq: Two times a day (BID) | ORAL | 3 refills | Status: DC

## 2024-04-22 MED ORDER — PROMETHAZINE HCL 25 MG PO TABS: 25.0000 mg | ORAL_TABLET | Freq: Three times a day (TID) | ORAL | 1 refills | Status: DC | PRN

## 2024-04-22 NOTE — Patient Instructions (Addendum)
 I would like for you to have labs completed at [facility] to check hemoglobin, white count, liver enzymes, kidney function, and electrolytes in light of frequent nausea and vomiting.   We will get you scheduled for an upper endoscopy with possible esophageal dilation if needed with Dr. Sammi Crick at [facility].  Increase omeprazole  to 40 mg twice daily 30 minutes before breakfast and dinner.  You can try using Phenergan  rather than Zofran  every 8 hours as needed for nausea/vomiting as Zofran  is not working for you.  As we discussed, this medication can cause drowsiness.   Follow a GERD diet:  Avoid fried, fatty, greasy, spicy, citrus foods. Avoid caffeine and carbonated beverages. Avoid chocolate. Try eating 4-6 small meals a day rather than 3 large meals. Do not eat within 3 hours of laying down. Prop head of bed up on wood or bricks to create a 6 inch incline.   Continue Trulance  3 mg daily.  Add MiraLAX 1 capful (17 g) 1-2 times daily every day to help with your constipation.  Mix MiraLAX into 8 ounces of water  or other noncarbonated beverage of your choice.  Will follow-up with you in the office after your upper endoscopy.  Shana Daring, PA-C [facility] Gastroenterology

## 2024-04-23 LAB — CBC WITH DIFFERENTIAL/PLATELET
Basophils Absolute: 0.1 10*3/uL (ref 0.0–0.2)
Basos: 1 %
EOS (ABSOLUTE): 0.1 10*3/uL (ref 0.0–0.4)
Eos: 2 %
Hematocrit: 45.7 % (ref 34.0–46.6)
Hemoglobin: 15.3 g/dL (ref 11.1–15.9)
Immature Grans (Abs): 0 10*3/uL (ref 0.0–0.1)
Immature Granulocytes: 0 %
Lymphocytes Absolute: 1.3 10*3/uL (ref 0.7–3.1)
Lymphs: 27 %
MCH: 30.2 pg (ref 26.6–33.0)
MCHC: 33.5 g/dL (ref 31.5–35.7)
MCV: 90 fL (ref 79–97)
Monocytes Absolute: 0.4 10*3/uL (ref 0.1–0.9)
Monocytes: 9 %
Neutrophils Absolute: 3 10*3/uL (ref 1.4–7.0)
Neutrophils: 61 %
Platelets: 216 10*3/uL (ref 150–450)
RBC: 5.07 x10E6/uL (ref 3.77–5.28)
RDW: 12 % (ref 11.7–15.4)
WBC: 4.8 10*3/uL (ref 3.4–10.8)

## 2024-04-23 LAB — CMP14+EGFR
ALT: 17 IU/L (ref 0–32)
AST: 19 IU/L (ref 0–40)
Albumin: 4.3 g/dL (ref 3.9–4.9)
Alkaline Phosphatase: 99 IU/L (ref 44–121)
BUN/Creatinine Ratio: 5 — ABNORMAL LOW (ref 9–23)
BUN: 4 mg/dL — ABNORMAL LOW (ref 6–24)
Bilirubin Total: 0.4 mg/dL (ref 0.0–1.2)
CO2: 22 mmol/L (ref 20–29)
Calcium: 9.6 mg/dL (ref 8.7–10.2)
Chloride: 101 mmol/L (ref 96–106)
Creatinine, Ser: 0.78 mg/dL (ref 0.57–1.00)
Globulin, Total: 2.5 g/dL (ref 1.5–4.5)
Glucose: 99 mg/dL (ref 70–99)
Potassium: 3.8 mmol/L (ref 3.5–5.2)
Sodium: 139 mmol/L (ref 134–144)
Total Protein: 6.8 g/dL (ref 6.0–8.5)
eGFR: 95 mL/min/{1.73_m2} (ref >59)

## 2024-04-25 ENCOUNTER — Ambulatory Visit: Payer: Self-pay | Admitting: Gastroenterology

## 2024-04-29 ENCOUNTER — Other Ambulatory Visit: Payer: Self-pay | Admitting: Medical Genetics

## 2024-05-03 ENCOUNTER — Other Ambulatory Visit (HOSPITAL_COMMUNITY)

## 2024-05-04 ENCOUNTER — Other Ambulatory Visit (HOSPITAL_COMMUNITY)
Admission: RE | Admit: 2024-05-04 | Discharge: 2024-05-04 | Disposition: A | Discharge status: Home / Self Care | Payer: Self-pay | Source: Ambulatory Visit | Attending: Oncology | Admitting: Oncology

## 2024-05-10 DIAGNOSIS — F988 Other specified behavioral and emotional disorders with onset usually occurring in childhood and adolescence: Secondary | ICD-10-CM | POA: Diagnosis not present

## 2024-05-10 DIAGNOSIS — Z6832 Body mass index (BMI) 32.0-32.9, adult: Secondary | ICD-10-CM | POA: Diagnosis not present

## 2024-05-10 NOTE — Telephone Encounter (Signed)
 Deborah Humphrey, is there a PA pending for omeprazole  40 mg BID for patient? (Kristen's patient).

## 2024-05-12 ENCOUNTER — Telehealth: Payer: Self-pay | Admitting: *Deleted

## 2024-05-12 ENCOUNTER — Encounter (HOSPITAL_COMMUNITY): Payer: Self-pay | Admitting: Gastroenterology

## 2024-05-12 ENCOUNTER — Other Ambulatory Visit: Payer: Self-pay | Admitting: *Deleted

## 2024-05-12 ENCOUNTER — Ambulatory Visit (HOSPITAL_COMMUNITY): Admitting: Anesthesiology

## 2024-05-12 ENCOUNTER — Encounter (HOSPITAL_COMMUNITY)
Admission: RE | Disposition: A | Discharge status: Home / Self Care | Payer: Self-pay | Source: Home / Self Care | Attending: Gastroenterology

## 2024-05-12 ENCOUNTER — Ambulatory Visit (HOSPITAL_COMMUNITY)
Admission: RE | Admit: 2024-05-12 | Discharge: 2024-05-12 | Disposition: A | Discharge status: Home / Self Care | Attending: Gastroenterology | Admitting: Gastroenterology

## 2024-05-12 ENCOUNTER — Other Ambulatory Visit: Payer: Self-pay

## 2024-05-12 DIAGNOSIS — F419 Anxiety disorder, unspecified: Secondary | ICD-10-CM | POA: Diagnosis not present

## 2024-05-12 DIAGNOSIS — R110 Nausea: Secondary | ICD-10-CM

## 2024-05-12 DIAGNOSIS — K219 Gastro-esophageal reflux disease without esophagitis: Secondary | ICD-10-CM | POA: Diagnosis not present

## 2024-05-12 DIAGNOSIS — Z79899 Other long term (current) drug therapy: Secondary | ICD-10-CM | POA: Diagnosis not present

## 2024-05-12 DIAGNOSIS — Z7984 Long term (current) use of oral hypoglycemic drugs: Secondary | ICD-10-CM | POA: Insufficient documentation

## 2024-05-12 DIAGNOSIS — K2270 Barrett's esophagus without dysplasia: Secondary | ICD-10-CM

## 2024-05-12 DIAGNOSIS — K2950 Unspecified chronic gastritis without bleeding: Secondary | ICD-10-CM | POA: Diagnosis not present

## 2024-05-12 DIAGNOSIS — I10 Essential (primary) hypertension: Secondary | ICD-10-CM | POA: Insufficient documentation

## 2024-05-12 DIAGNOSIS — R1310 Dysphagia, unspecified: Secondary | ICD-10-CM | POA: Diagnosis not present

## 2024-05-12 DIAGNOSIS — K5900 Constipation, unspecified: Secondary | ICD-10-CM | POA: Insufficient documentation

## 2024-05-12 DIAGNOSIS — K31A19 Gastric intestinal metaplasia without dysplasia, unspecified site: Secondary | ICD-10-CM | POA: Diagnosis not present

## 2024-05-12 DIAGNOSIS — Z87891 Personal history of nicotine dependence: Secondary | ICD-10-CM | POA: Diagnosis not present

## 2024-05-12 DIAGNOSIS — K581 Irritable bowel syndrome with constipation: Secondary | ICD-10-CM | POA: Diagnosis not present

## 2024-05-12 DIAGNOSIS — F429 Obsessive-compulsive disorder, unspecified: Secondary | ICD-10-CM | POA: Insufficient documentation

## 2024-05-12 DIAGNOSIS — K3189 Other diseases of stomach and duodenum: Secondary | ICD-10-CM | POA: Diagnosis not present

## 2024-05-12 HISTORY — PX: ESOPHAGEAL DILATION: SHX303

## 2024-05-12 HISTORY — PX: ESOPHAGOGASTRODUODENOSCOPY: SHX5428

## 2024-05-12 SURGERY — EGD (ESOPHAGOGASTRODUODENOSCOPY)
Anesthesia: General

## 2024-05-12 MED ORDER — LACTATED RINGERS IV SOLN
INTRAVENOUS | Status: DC
Start: 2024-05-12 — End: 2024-05-12

## 2024-05-12 MED ORDER — DEXMEDETOMIDINE HCL IN NACL 80 MCG/20ML IV SOLN
INTRAVENOUS | Status: DC | PRN
Start: 2024-05-12 — End: 2024-05-12
  Administered 2024-05-12 (×2): 4 ug via INTRAVENOUS

## 2024-05-12 MED ORDER — ONDANSETRON 4 MG PO TBDP: 4.0000 mg | ORAL_TABLET | Freq: Three times a day (TID) | ORAL | 2 refills | Status: AC | PRN

## 2024-05-12 MED ORDER — PROPOFOL 10 MG/ML IV BOLUS
INTRAVENOUS | Status: DC | PRN
  Administered 2024-05-12: 150 ug/kg/min via INTRAVENOUS
  Administered 2024-05-12: 40 mg via INTRAVENOUS
  Administered 2024-05-12: 70 mg via INTRAVENOUS
  Administered 2024-05-12: 30 mg via INTRAVENOUS
  Administered 2024-05-12: 40 mg via INTRAVENOUS

## 2024-05-12 MED ORDER — LACTATED RINGERS IV SOLN
INTRAVENOUS | Status: DC | PRN
Start: 2024-05-12 — End: 2024-05-12

## 2024-05-12 MED ORDER — LIDOCAINE 2% (20 MG/ML) 5 ML SYRINGE
INTRAMUSCULAR | Status: DC | PRN
  Administered 2024-05-12: 60 mg via INTRAVENOUS

## 2024-05-12 MED ORDER — GLYCOPYRROLATE PF 0.2 MG/ML IJ SOSY
PREFILLED_SYRINGE | INTRAMUSCULAR | Status: DC | PRN
  Administered 2024-05-12 (×2): .1 mg via INTRAVENOUS

## 2024-05-12 NOTE — Discharge Instructions (Addendum)
 You are being discharged to home.  Resume your previous diet.  We are waiting for your pathology results.  Continue your present medications.  Can take Zofran  as needed for nausea. Schedule gastric emptying study - will need to be off Zofran  for 7 days.

## 2024-05-12 NOTE — Transfer of Care (Addendum)
 Immediate Anesthesia Transfer of Care Note  Patient: Deborah Humphrey  Procedure(s) Performed: EGD (ESOPHAGOGASTRODUODENOSCOPY) DILATION, ESOPHAGUS  Patient Location: Endoscopy Unit  Anesthesia Type:General  Level of Consciousness: drowsy and patient cooperative  Airway & Oxygen Therapy: Patient Spontanous Breathing  Post-op Assessment: Report given to RN and Post -op Vital signs reviewed and stable  Post vital signs: Reviewed and stable  Last Vitals:  Vitals Value Taken Time  BP 93/49 05/12/24   1007  Temp 37.1 05/12/24   1007  Pulse 67 05/12/24   1007  Resp 14 05/12/24   1007  SpO2 95% 05/12/24   1007    Last Pain:  Vitals:   05/12/24 0945  TempSrc:   PainSc: 0-No pain      Patients Stated Pain Goal: 5 (05/12/24 0759)  Complications: No notable events documented.

## 2024-05-12 NOTE — Op Note (Signed)
 [facility] Patient Name: Deborah Humphrey Procedure Date: 05/12/2024 9:33 AM MRN: [id number] Date of Birth: Jan 15, 1976 Attending MD: Samantha Cress , , [id number] CSN: [id number] Age: 48 Admit Type: Outpatient Procedure:                Upper GI endoscopy Indications:              Follow-up of Barrett's esophagus, Nausea with                            vomiting Providers:                Samantha Cress, Willena Harp, Alisa App Referring MD:              Medicines:                Monitored Anesthesia Care Complications:            No immediate complications. Estimated Blood Loss:     Estimated blood loss: none. Procedure:                Pre-Anesthesia Assessment:                           - Prior to the procedure, a History and Physical                            was performed, and patient medications, allergies                            and sensitivities were reviewed. The patient's                            tolerance of previous anesthesia was reviewed.                           - The risks and benefits of the procedure and the                            sedation options and risks were discussed with the                            patient. All questions were answered and informed                            consent was obtained.                           - ASA Grade Assessment: II - A patient with mild                            systemic disease.                           After obtaining informed consent, the endoscope was                            passed under direct vision. Throughout the  procedure, the patient's blood pressure, pulse, and                            oxygen saturations were monitored continuously. The                            [id number]-H190 ([id number]) scope was introduced through the                            mouth, and advanced to the second part of duodenum.                            The upper GI endoscopy was accomplished  without                            difficulty. The patient tolerated the procedure                            well. Scope In: 9:56:27 AM Scope Out: 10:03:26 AM Total Procedure Duration: 0 hours 6 minutes 59 seconds  Findings:      The esophagus and gastroesophageal junction were examined with white       light and narrow band imaging (NBI). There were esophageal mucosal       changes consistent with short-segment Barrett's esophagus. These changes       involved the mucosa at the upper extent of the gastric folds (38 cm from       the incisors) extending to the Z-line (36 cm from the incisors). One       tongue of salmon-colored mucosa was present. The maximum longitudinal       extent of these esophageal mucosal changes was 2 cm in length. This was       biopsied with a cold forceps for histology.      The entire examined stomach was normal. Biopsies were taken with a cold       forceps for Helicobacter pylori testing.      The examined duodenum was normal. Biopsies were taken with a cold       forceps for histology. Impression:               - Esophageal mucosal changes consistent with                            short-segment Barrett's esophagus. Biopsied.                           - Normal stomach. Biopsied.                           - Normal examined duodenum. Biopsied. Moderate Sedation:      Per Anesthesia Care Recommendation:           - Discharge patient to home (ambulatory).                           - Resume previous diet.                           -  Await pathology results.                           - Continue present medications.                           - Can take Zofran  as needed for nausea.                           - Schedule gastric emptying study - will need to be                            off Zofran  for 7 days. Procedure Code(s):        --- Professional ---                           [phone], Esophagogastroduodenoscopy, flexible,                             transoral; with biopsy, single or multiple Diagnosis Code(s):        --- Professional ---                           K22.70, Barrett's esophagus without dysplasia                           R11.2, Nausea with vomiting, unspecified CPT copyright 2022 American Medical Association. All rights reserved. The codes documented in this report are preliminary and upon coder review may  be revised to meet current compliance requirements. Samantha Cress, MD Samantha Cress,  05/12/2024 10:11:50 AM This report has been signed electronically. Number of Addenda: 0

## 2024-05-12 NOTE — Telephone Encounter (Signed)
 Availty PA: Procedure Code 1 54098  Quantity 1 Days  Procedure From - To Date 2024-05-12  Status NO AUTH REQUIRED  Message PRECERT IS NOT REQUIRED OR ONLY REQUIRED IN UNIQUE CIRCUMSTANCES FOR MEDICAID REFER TO PRIOR AUTH TOOL ON [email or web address] FOR ALL OTHERS REFER TO CODE SEARCH TOOL ON [email or web address] COVERAGE OF SERVICES ARE SUBJECT TO BENEFITS AND ELIGIBILITY

## 2024-05-12 NOTE — Anesthesia Postprocedure Evaluation (Signed)
 Anesthesia Post Note  Patient: Deborah Humphrey  Procedure(s) Performed: EGD (ESOPHAGOGASTRODUODENOSCOPY) DILATION, ESOPHAGUS  Patient location during evaluation: Endoscopy Anesthesia Type: General Level of consciousness: awake and alert Pain management: pain level controlled Vital Signs Assessment: post-procedure vital signs reviewed and stable Respiratory status: spontaneous breathing, nonlabored ventilation and respiratory function stable Cardiovascular status: blood pressure returned to baseline and stable Postop Assessment: no apparent nausea or vomiting Anesthetic complications: no   There were no known notable events for this encounter.   Last Vitals:  Vitals:   05/12/24 1013 05/12/24 1017  BP: (!) 89/57 104/67  Pulse: 71 75  Resp: 15 18  Temp:    SpO2: 95% 99%    Last Pain:  Vitals:   05/12/24 1007  TempSrc: Oral  PainSc: 0-No pain                 Deborah Humphrey

## 2024-05-12 NOTE — Interval H&P Note (Signed)
 History and Physical Interval Note:  05/12/2024 8:22 AM  Deborah Humphrey  has presented today for surgery, with the diagnosis of dysphagia,Barrett's,GERD,N/V.  The various methods of treatment have been discussed with the patient and family. After consideration of risks, benefits and other options for treatment, the patient has consented to  Procedure(s) with comments: EGD (ESOPHAGOGASTRODUODENOSCOPY) (N/A) - 12:45 pm, asa 2 DILATION, ESOPHAGUS (N/A) as a surgical intervention.  The patient's history has been reviewed, patient examined, no change in status, stable for surgery.  I have reviewed the patient's chart and labs.  Questions were answered to the patient's satisfaction.     [name]

## 2024-05-12 NOTE — Anesthesia Preprocedure Evaluation (Signed)
 Anesthesia Evaluation  Patient identified by MRN, date of birth, ID band Patient awake    Reviewed: Allergy & Precautions, NPO status , Patient's Chart, lab work & pertinent test results  History of Anesthesia Complications Negative for: history of anesthetic complications  Airway Mallampati: II  TM Distance: >3 FB Neck ROM: Full   Comment: cervical spine fx Dental  (+) Dental Advisory Given, Upper Dentures   Pulmonary former smoker   Pulmonary exam normal breath sounds clear to auscultation       Cardiovascular Exercise Tolerance: Good hypertension, Normal cardiovascular exam Rhythm:Regular Rate:Normal     Neuro/Psych  PSYCHIATRIC DISORDERS Anxiety     negative neurological ROS     GI/Hepatic Neg liver ROS,GERD  Medicated and Controlled,,  Endo/Other  negative endocrine ROS    Renal/GU negative Renal ROS  negative genitourinary   Musculoskeletal  (+) Arthritis  (cervical spine fx),    Abdominal   Peds negative pediatric ROS (+)  Hematology negative hematology ROS (+)   Anesthesia Other Findings   Reproductive/Obstetrics negative OB ROS                             Anesthesia Physical Anesthesia Plan  ASA: 2  Anesthesia Plan: General   Post-op Pain Management: Minimal or no pain anticipated   Induction: Intravenous  PONV Risk Score and Plan: Propofol  infusion  Airway Management Planned: Nasal Cannula and Natural Airway  Additional Equipment: None  Intra-op Plan:   Post-operative Plan:   Informed Consent: I have reviewed the patients History and Physical, chart, labs and discussed the procedure including the risks, benefits and alternatives for the proposed anesthesia with the patient or authorized representative who has indicated his/her understanding and acceptance.     Dental advisory given  Plan Discussed with: CRNA  Anesthesia Plan Comments:          Anesthesia Quick Evaluation

## 2024-05-12 NOTE — Telephone Encounter (Signed)
 Message Received: Today Urban Garden, MD  Feliz Hosteller, CMA; Alvester Johnson, LPN Hi Mindy/Annalei Friesz,  Can you please schedule a gastric emptying study? Dx: Persistent nausea, rule out gastroparesis.  She needs to stop Zofran  7 days prior to test.  Thanks,  Samantha Cress, MD Gastroenterology and Hepatology [facility] Gastroenterology

## 2024-05-13 ENCOUNTER — Encounter (HOSPITAL_COMMUNITY): Payer: Self-pay | Admitting: Gastroenterology

## 2024-05-13 ENCOUNTER — Ambulatory Visit (INDEPENDENT_AMBULATORY_CARE_PROVIDER_SITE_OTHER): Payer: Self-pay | Admitting: Gastroenterology

## 2024-05-13 LAB — SURGICAL PATHOLOGY

## 2024-05-14 ENCOUNTER — Other Ambulatory Visit (INDEPENDENT_AMBULATORY_CARE_PROVIDER_SITE_OTHER): Payer: Self-pay

## 2024-05-14 ENCOUNTER — Telehealth (INDEPENDENT_AMBULATORY_CARE_PROVIDER_SITE_OTHER): Payer: Self-pay | Admitting: Gastroenterology

## 2024-05-14 DIAGNOSIS — R112 Nausea with vomiting, unspecified: Secondary | ICD-10-CM

## 2024-05-14 NOTE — Telephone Encounter (Signed)
 Pt contacted and verbalized understanding. Sent provider a secure chat to get diagnosis to use for cortisol.

## 2024-05-14 NOTE — Addendum Note (Signed)
 Addended by: Shauntavia Brackin on: 05/14/2024 10:29 AM   Modules accepted: Orders

## 2024-05-14 NOTE — Telephone Encounter (Signed)
 What dx would you like me to use for the cortisol on this pt?   27 mins [place], MD nausea and vomiting  DC thanks   Lab order placed. Pt is aware to go to lab first thing in the morning and to be NPO.

## 2024-05-14 NOTE — Telephone Encounter (Signed)
 Pt called in and states she had an EGD on 05/12/24. Pt states she is unable to keep anything down. Pt ate last night and vomited. Has not ate this morning due to being nauseated. Pt is able to take sips of water . Pt unable to take Zofran  for 7 days due to her gastric emptying study being 6/20. Pt is asking for recommendations. Please advise. Thank you.

## 2024-05-14 NOTE — Telephone Encounter (Signed)
 Please ask her to try ginger and can take some Benadryl  as needed, this may help in the meantime while we get her for her gastric emptying study. If she is not improving, then she can start taking the Zofran  with the caveat that it may affect her gastric emptying study.  Please also send her an order for AM cortisol level (needs to check this first thing in the morning). Thanks

## 2024-05-17 ENCOUNTER — Other Ambulatory Visit (HOSPITAL_COMMUNITY): Payer: Self-pay | Admitting: Adult Health

## 2024-05-17 DIAGNOSIS — Z1231 Encounter for screening mammogram for malignant neoplasm of breast: Secondary | ICD-10-CM

## 2024-05-18 DIAGNOSIS — R112 Nausea with vomiting, unspecified: Secondary | ICD-10-CM | POA: Diagnosis not present

## 2024-05-19 ENCOUNTER — Encounter: Payer: Self-pay | Admitting: Physician Assistant

## 2024-05-19 ENCOUNTER — Other Ambulatory Visit: Payer: Self-pay | Admitting: Physician Assistant

## 2024-05-19 ENCOUNTER — Ambulatory Visit (INDEPENDENT_AMBULATORY_CARE_PROVIDER_SITE_OTHER): Payer: Self-pay | Admitting: Gastroenterology

## 2024-05-19 ENCOUNTER — Ambulatory Visit (HOSPITAL_COMMUNITY)
Admission: RE | Admit: 2024-05-19 | Discharge: 2024-05-19 | Disposition: A | Discharge status: Home / Self Care | Source: Ambulatory Visit | Attending: Adult Health | Admitting: Adult Health

## 2024-05-19 DIAGNOSIS — R42 Dizziness and giddiness: Secondary | ICD-10-CM

## 2024-05-19 DIAGNOSIS — Z1231 Encounter for screening mammogram for malignant neoplasm of breast: Secondary | ICD-10-CM | POA: Diagnosis not present

## 2024-05-19 DIAGNOSIS — R519 Headache, unspecified: Secondary | ICD-10-CM

## 2024-05-19 LAB — CORTISOL-AM, BLOOD: Cortisol - AM: 19.9 ug/dL — ABNORMAL HIGH (ref 6.2–19.4)

## 2024-05-20 ENCOUNTER — Ambulatory Visit
Admission: RE | Admit: 2024-05-20 | Discharge: 2024-05-20 | Disposition: A | Discharge status: Home / Self Care | Source: Ambulatory Visit | Attending: Physician Assistant | Admitting: Physician Assistant

## 2024-05-20 DIAGNOSIS — R519 Headache, unspecified: Secondary | ICD-10-CM | POA: Diagnosis not present

## 2024-05-20 DIAGNOSIS — R42 Dizziness and giddiness: Secondary | ICD-10-CM | POA: Diagnosis not present

## 2024-05-21 ENCOUNTER — Encounter (HOSPITAL_COMMUNITY)
Admission: RE | Admit: 2024-05-21 | Discharge: 2024-05-21 | Disposition: A | Discharge status: Home / Self Care | Source: Ambulatory Visit | Attending: Gastroenterology

## 2024-05-21 ENCOUNTER — Ambulatory Visit (INDEPENDENT_AMBULATORY_CARE_PROVIDER_SITE_OTHER): Payer: Self-pay | Admitting: Gastroenterology

## 2024-05-21 ENCOUNTER — Encounter (HOSPITAL_COMMUNITY): Payer: Self-pay

## 2024-05-21 DIAGNOSIS — R110 Nausea: Secondary | ICD-10-CM | POA: Insufficient documentation

## 2024-05-21 MED ORDER — TECHNETIUM TC 99M SULFUR COLLOID
2.0000 | Freq: Once | INTRAVENOUS | Status: AC | PRN
  Administered 2024-05-21: 2.2 via ORAL

## 2024-05-26 ENCOUNTER — Ambulatory Visit: Payer: Self-pay | Admitting: Adult Health

## 2024-05-31 ENCOUNTER — Ambulatory Visit (INDEPENDENT_AMBULATORY_CARE_PROVIDER_SITE_OTHER): Admitting: Gastroenterology

## 2024-06-21 DIAGNOSIS — R202 Paresthesia of skin: Secondary | ICD-10-CM | POA: Diagnosis not present

## 2024-06-21 DIAGNOSIS — F988 Other specified behavioral and emotional disorders with onset usually occurring in childhood and adolescence: Secondary | ICD-10-CM | POA: Diagnosis not present

## 2024-06-21 DIAGNOSIS — Z6832 Body mass index (BMI) 32.0-32.9, adult: Secondary | ICD-10-CM | POA: Diagnosis not present

## 2024-07-07 ENCOUNTER — Encounter (INDEPENDENT_AMBULATORY_CARE_PROVIDER_SITE_OTHER): Payer: Self-pay

## 2024-07-13 DIAGNOSIS — F988 Other specified behavioral and emotional disorders with onset usually occurring in childhood and adolescence: Secondary | ICD-10-CM | POA: Diagnosis not present

## 2024-07-13 DIAGNOSIS — Z6832 Body mass index (BMI) 32.0-32.9, adult: Secondary | ICD-10-CM | POA: Diagnosis not present

## 2024-07-13 DIAGNOSIS — R202 Paresthesia of skin: Secondary | ICD-10-CM | POA: Diagnosis not present

## 2024-07-28 ENCOUNTER — Telehealth (INDEPENDENT_AMBULATORY_CARE_PROVIDER_SITE_OTHER): Payer: Self-pay | Admitting: Gastroenterology

## 2024-07-28 ENCOUNTER — Other Ambulatory Visit: Payer: Self-pay | Admitting: Gastroenterology

## 2024-07-28 DIAGNOSIS — K581 Irritable bowel syndrome with constipation: Secondary | ICD-10-CM

## 2024-07-28 MED ORDER — TRULANCE 3 MG PO TABS: 1.0000 | ORAL_TABLET | Freq: Every day | ORAL | 3 refills | Status: DC

## 2024-07-28 NOTE — Telephone Encounter (Signed)
 Medication sent to pharmacy

## 2024-07-28 NOTE — Telephone Encounter (Signed)
 Pt left voicemail stating that she is needing a script sent in for Trulance  to [facility]. She is needing this so she can get the out of pocket cost and place on application for assistance for Trulance . Please advise. Thank you.

## 2024-07-28 NOTE — Telephone Encounter (Signed)
Left detailed message on pt voicemail (OK per DPR)

## 2024-08-03 ENCOUNTER — Telehealth (INDEPENDENT_AMBULATORY_CARE_PROVIDER_SITE_OTHER): Payer: Self-pay

## 2024-08-03 NOTE — Telephone Encounter (Signed)
 Patient brought her application to the office and I have filled in our portion and placed on Dr. Samuel desk for signature. Once I have filled this out I will forward this to the patient assistance program.     [facility] stating your renewal for free medication (Trulance ) is about to expire on August 25, 2024.

## 2024-08-03 NOTE — Telephone Encounter (Signed)
 Patient assistance forms on your desk please sign at your convenience. Thanks

## 2024-08-03 NOTE — Telephone Encounter (Signed)
 Thanks

## 2024-08-04 DIAGNOSIS — G5621 Lesion of ulnar nerve, right upper limb: Secondary | ICD-10-CM | POA: Diagnosis not present

## 2024-08-04 DIAGNOSIS — M542 Cervicalgia: Secondary | ICD-10-CM | POA: Diagnosis not present

## 2024-08-04 DIAGNOSIS — M7581 Other shoulder lesions, right shoulder: Secondary | ICD-10-CM | POA: Diagnosis not present

## 2024-08-05 NOTE — Telephone Encounter (Signed)
-------  Fax Transmission Report-------  To:               Recipient at [phone] Subject:          [employer] Patient assistance S. P Trulance  Result:           The transmission was successful. Explanation:      All Pages Ok Pages Sent:       23 Connect Time:     20 minutes, 16 seconds Transmit Time:    08/05/2024 10:06 Transfer Rate:    14400 Status Code:      0000 Retry Count:      1 Job Id:           [id number] Unique Id:        [id number] Fax Line:         26 Fax Server:       [id number]

## 2024-08-05 NOTE — Telephone Encounter (Signed)
 Pap forms for [employer] patient assistance signed and records attached faxed to [phone] attention [employer] Patient assistance for Trulance  3 mg.

## 2024-08-09 NOTE — Telephone Encounter (Signed)
-------  Fax Transmission Report-------  To:               Recipient at [phone] Subject:          Information corrected, please expedite. Thanks Result:           The transmission was successful. Explanation:      All Pages Ok Pages Sent:       24 Connect Time:     24 minutes, 25 seconds Transmit Time:    08/09/2024 14:27 Transfer Rate:    14400 Status Code:      0000 Retry Count:      0 Job Id:           [id number] Unique Id:        [id number] Fax Line:         34 Fax Server:       [id number]

## 2024-08-09 NOTE — Telephone Encounter (Signed)
 Received a form back from Deborah Humphrey stating application was missing information. I have reviewed and fixed and resubmitted the application.

## 2024-08-11 NOTE — Telephone Encounter (Signed)
 08/11/2024: I called the [employer] patient assistance [employer] and spoke with Ashauntee, who says they have not received the updated information that was faxed to them and confirmation received from 08/09/2024. She asked that I refax this information, and I have done so.

## 2024-08-12 NOTE — Telephone Encounter (Signed)
 This message was sent via FAXCOM, a product from [employer]. [email or web address].biscom.com/                    -------Fax Transmission Report-------  To:               Recipient at [phone] Subject:          Updated information that you requested for this patient Trulance  assistance Result:           The transmission was successful. Explanation:      All Pages Ok Pages Sent:       28 Connect Time:     25 minutes, 10 seconds Transmit Time:    08/11/2024 16:29 Transfer Rate:    14400 Status Code:      0000 Retry Count:      0 Job Id:           [id number] Unique Id:        [id number] Fax Line:         38 Fax Server:       [id number]

## 2024-08-17 NOTE — Telephone Encounter (Signed)
 I received a letter from [employer] stating the patient is approved for free Trulance  3 mg once per day through August 16, 2025. I made the patient aware of this and advised [employer] had requested she call them to set up shipment for refills at [phone] - [phone]. Patient states understanding.

## 2024-08-18 ENCOUNTER — Other Ambulatory Visit: Payer: Self-pay | Admitting: Gastroenterology

## 2024-08-18 DIAGNOSIS — K219 Gastro-esophageal reflux disease without esophagitis: Secondary | ICD-10-CM

## 2024-08-26 DIAGNOSIS — Z6831 Body mass index (BMI) 31.0-31.9, adult: Secondary | ICD-10-CM | POA: Diagnosis not present

## 2024-08-26 DIAGNOSIS — F988 Other specified behavioral and emotional disorders with onset usually occurring in childhood and adolescence: Secondary | ICD-10-CM | POA: Diagnosis not present

## 2024-09-15 ENCOUNTER — Encounter (INDEPENDENT_AMBULATORY_CARE_PROVIDER_SITE_OTHER): Payer: Self-pay | Admitting: Gastroenterology

## 2024-11-22 ENCOUNTER — Telehealth: Payer: Self-pay | Admitting: Internal Medicine

## 2024-11-22 NOTE — Telephone Encounter (Signed)
 Patient called to ask to be switched over to Dr. Cindie since Dr. Eartha is leaving.  I have changed the Drs. Name in the recalls coming up.  She also asked if she could get the script filled for Trulance  with [facility].  She stated she would like a call back when the refill was done.

## 2024-11-22 NOTE — Telephone Encounter (Signed)
 Noted.  Paperwork done for assistance in Sept. No need to re-do this. Once pt call for refill we will just phone it in

## 2024-11-29 ENCOUNTER — Telehealth (INDEPENDENT_AMBULATORY_CARE_PROVIDER_SITE_OTHER): Payer: Self-pay

## 2024-11-29 DIAGNOSIS — K581 Irritable bowel syndrome with constipation: Secondary | ICD-10-CM

## 2024-11-29 MED ORDER — TRULANCE 3 MG PO TABS: 1.0000 | ORAL_TABLET | Freq: Every day | ORAL | 3 refills | Status: AC

## 2024-11-29 NOTE — Telephone Encounter (Signed)
 Patient called today stating she is in need of having the Trulance  3 mg once per day # 90 with 3 refills sent to [facility] patient assistance program. Per patient request I have called this in to [facility] patient assistance program, spoke with Candace R (pharmacist) and she will process this and ship out to the patient.

## 2024-12-13 ENCOUNTER — Other Ambulatory Visit: Payer: Self-pay | Admitting: Gastroenterology

## 2024-12-13 DIAGNOSIS — K219 Gastro-esophageal reflux disease without esophagitis: Secondary | ICD-10-CM

## 2024-12-22 ENCOUNTER — Other Ambulatory Visit (INDEPENDENT_AMBULATORY_CARE_PROVIDER_SITE_OTHER): Payer: Self-pay

## 2024-12-22 ENCOUNTER — Telehealth (INDEPENDENT_AMBULATORY_CARE_PROVIDER_SITE_OTHER): Payer: Self-pay

## 2024-12-22 DIAGNOSIS — K219 Gastro-esophageal reflux disease without esophagitis: Secondary | ICD-10-CM

## 2024-12-22 MED ORDER — FAMOTIDINE 40 MG PO TABS: 40.0000 mg | ORAL_TABLET | Freq: Every day | ORAL | 3 refills | Status: AC

## 2024-12-22 NOTE — Telephone Encounter (Signed)
 [facility] faxed a refill request over for patient's Famotidine  40 mg daily.   Patient last seen by Josette Centers on 04/22/2024 at which time she recommended that the patient start famotidine  40 mg at bedtime.   I have sent the refill in to [facility].

## 2025-01-11 ENCOUNTER — Ambulatory Visit: Admitting: Neurology
